# Patient Record
Sex: Female | Born: 1965
Health system: Southern US, Community
[De-identification: ages and names within clinical notes are randomized; demographics above are authoritative.]

## PROBLEM LIST (undated history)

## (undated) DIAGNOSIS — M545 Low back pain, unspecified: Secondary | ICD-10-CM

## (undated) DIAGNOSIS — M25569 Pain in unspecified knee: Secondary | ICD-10-CM

## (undated) DIAGNOSIS — K219 Gastro-esophageal reflux disease without esophagitis: Secondary | ICD-10-CM

## (undated) HISTORY — DX: Pain in unspecified knee: M25.569

## (undated) HISTORY — DX: Low back pain: M54.5

## (undated) HISTORY — DX: Low back pain, unspecified: M54.50

## (undated) HISTORY — PX: OTHER SURGICAL HISTORY: SHX169

## (undated) HISTORY — DX: Gastro-esophageal reflux disease without esophagitis: K21.9

---

## 2007-03-22 ENCOUNTER — Ambulatory Visit: Payer: Self-pay | Admitting: Obstetrics and Gynecology

## 2007-04-19 ENCOUNTER — Ambulatory Visit: Payer: Self-pay | Admitting: Obstetrics and Gynecology

## 2008-04-22 ENCOUNTER — Emergency Department (HOSPITAL_COMMUNITY): Admission: EM | Admit: 2008-04-22 | Discharge: 2008-04-22 | Payer: Self-pay | Admitting: Emergency Medicine

## 2010-12-27 ENCOUNTER — Emergency Department (HOSPITAL_COMMUNITY)
Admission: EM | Admit: 2010-12-27 | Discharge: 2010-12-27 | Disposition: A | Discharge status: Home / Self Care | Payer: Self-pay | Attending: Emergency Medicine | Admitting: Emergency Medicine

## 2010-12-27 ENCOUNTER — Emergency Department (HOSPITAL_COMMUNITY): Payer: Self-pay

## 2010-12-27 DIAGNOSIS — R509 Fever, unspecified: Secondary | ICD-10-CM | POA: Insufficient documentation

## 2010-12-27 DIAGNOSIS — R51 Headache: Secondary | ICD-10-CM | POA: Insufficient documentation

## 2010-12-27 DIAGNOSIS — R05 Cough: Secondary | ICD-10-CM | POA: Insufficient documentation

## 2010-12-27 DIAGNOSIS — IMO0001 Reserved for inherently not codable concepts without codable children: Secondary | ICD-10-CM | POA: Insufficient documentation

## 2010-12-27 DIAGNOSIS — J3489 Other specified disorders of nose and nasal sinuses: Secondary | ICD-10-CM | POA: Insufficient documentation

## 2010-12-27 DIAGNOSIS — R059 Cough, unspecified: Secondary | ICD-10-CM | POA: Insufficient documentation

## 2010-12-27 DIAGNOSIS — J069 Acute upper respiratory infection, unspecified: Secondary | ICD-10-CM | POA: Insufficient documentation

## 2011-02-07 ENCOUNTER — Other Ambulatory Visit: Payer: Self-pay | Admitting: Obstetrics and Gynecology

## 2011-02-07 ENCOUNTER — Encounter (INDEPENDENT_AMBULATORY_CARE_PROVIDER_SITE_OTHER): Payer: Self-pay | Admitting: Obstetrics and Gynecology

## 2011-02-07 DIAGNOSIS — N912 Amenorrhea, unspecified: Secondary | ICD-10-CM

## 2011-02-07 DIAGNOSIS — Z01419 Encounter for gynecological examination (general) (routine) without abnormal findings: Secondary | ICD-10-CM

## 2011-02-08 NOTE — Group Therapy Note (Signed)
[name], [name] NO.:  [id number]  MEDICAL RECORD NO.:  [id number]           PATIENT TYPE:  A  LOCATION:  [facility]                   FACILITY:  [facility]  PHYSICIAN:  Argentina Donovan, MD        DATE OF BIRTH:  Aug 17, 1966  DATE OF SERVICE:  02/07/2011                                 CLINIC NOTE  CHIEF COMPLAINT:  Annual exam.  HISTORY OF PRESENT ILLNESS:  This is a 45 year old [name] female G6, P6-0-0-6, here for annual exam.  She has no complaints today.  She does report her last Pap smear was about 3-4 years ago and is unsure if she has ever had a mammogram, I believe she has not.  She reports her last menstrual period was 8 months ago.  She denies any menopausal symptoms.  She has not been sexually active in the last 2 months, but was previously sexually active.  PAST MEDICAL HISTORY:  None.  SURGICAL HISTORY:  None.  FAMILY HISTORY:  No family history breast cancer, no history of ovarian, endometrial, or colon cancer.  GYNECOLOGICAL HISTORY:  No abnormal Pap smears, sexually transmitted diseases.  OB HISTORY:  Six pregnancies, 6 deliveries, all are term, no complications.  SOCIAL HISTORY:  No tobacco, alcohol, or drugs.  She does work in [employer] currently.  MEDICATIONS:  None.  ALLERGIES:  No known drug allergies.  PHYSICAL EXAMINATION:  VITAL SIGNS:  Temperature 98.0, pulse 86, blood pressure 123/81, weight 193, height 60 inches. GENERAL:  Pleasant female, in no acute distress. HEENT:  Head atraumatic, normocephalic. HEART:  Regular rate and rhythm.  No murmurs, rubs, or gallops. LUNGS:  Clear to auscultation bilaterally.  No wheeze, rhonchi, or rales. ABDOMEN:  Soft, nontender, nondistended.  Bowel sounds x4. GU:  No external lesions.  No discharge in the vagina or cervix.  No Pap smear performed.  No adnexal tenderness on exam.  Uterus appears anteverted.  No masses or tenderness.  Ovaries palpated with no abnormal mass or  lesions noted. EXTREMITIES:  No cyanosis, clubbing, or edema. BREASTS:  No palpable masses or nodules.  No concerning lesions in the nipple.  ASSESSMENT AND PLAN:  A 45 year old G6, P6-0-0-6 who presents for annual exam.  Pap smear performed, also breast exam performed.  We will refer for mammogram and also obtained serum pregnancy due to history of amenorrhea.  I instructed the patient to return yearly for annual exams. She reports understanding.    ______________________________ Dr. Sharol Given   ______________________________ Argentina Donovan, MD   /MEDQ  D:  02/07/2011  T:  02/08/2011  Job:  [id number]

## 2011-02-15 NOTE — Group Therapy Note (Signed)
[name], LOSCHIAVO NO.:  [id number]   MEDICAL RECORD NO.:  [id number]          PATIENT TYPE:  WOC   LOCATION:  [facility]                   FACILITY:  [facility]   PHYSICIAN:  Argentina Donovan, MD        DATE OF BIRTH:  [birth year]   DATE OF SERVICE:  03/22/2007                                  CLINIC NOTE   The patient is a 45 year old French-speaking [name] from the [place], gravida 6, para 6-0-0-6, who had an abnormal Pap smear at the  [facility] department and had an unsatisfactory colposcopy because of some  abnormal cells in the cervix and was sent in for question of LEEP versus  Cytotec followed by re-colposcopy.  Since her original Pap smear only  showed possibly a CIN-1, I have decided we will try and do the Cytotec.  I have ordered her 600 mg to be taking 2-3 hours prior to the procedure  and she will reschedule a colposcopy.   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

## 2011-03-16 ENCOUNTER — Other Ambulatory Visit: Payer: Self-pay | Admitting: Obstetrics & Gynecology

## 2011-03-16 DIAGNOSIS — Z1231 Encounter for screening mammogram for malignant neoplasm of breast: Secondary | ICD-10-CM

## 2011-03-24 ENCOUNTER — Ambulatory Visit (HOSPITAL_COMMUNITY)
Admission: RE | Admit: 2011-03-24 | Discharge: 2011-03-24 | Disposition: A | Discharge status: Home / Self Care | Payer: Self-pay | Source: Ambulatory Visit | Attending: Obstetrics & Gynecology | Admitting: Obstetrics & Gynecology

## 2011-03-24 DIAGNOSIS — Z1231 Encounter for screening mammogram for malignant neoplasm of breast: Secondary | ICD-10-CM

## 2011-07-01 LAB — URINALYSIS, ROUTINE W REFLEX MICROSCOPIC
Glucose, UA: NEGATIVE
Protein, ur: NEGATIVE
Specific Gravity, Urine: 1.03
Urobilinogen, UA: 1

## 2011-07-01 LAB — URINE MICROSCOPIC-ADD ON

## 2011-07-18 LAB — POCT PREGNANCY, URINE: Operator id: 149021

## 2011-12-08 ENCOUNTER — Encounter: Payer: Self-pay | Admitting: Family Medicine

## 2011-12-08 ENCOUNTER — Ambulatory Visit (INDEPENDENT_AMBULATORY_CARE_PROVIDER_SITE_OTHER): Payer: Self-pay | Admitting: Family Medicine

## 2011-12-08 DIAGNOSIS — Z789 Other specified health status: Secondary | ICD-10-CM

## 2011-12-08 DIAGNOSIS — Z609 Problem related to social environment, unspecified: Secondary | ICD-10-CM

## 2011-12-08 DIAGNOSIS — Z758 Other problems related to medical facilities and other health care: Secondary | ICD-10-CM | POA: Insufficient documentation

## 2011-12-08 DIAGNOSIS — M6740 Ganglion, unspecified site: Secondary | ICD-10-CM | POA: Insufficient documentation

## 2011-12-08 DIAGNOSIS — E669 Obesity, unspecified: Secondary | ICD-10-CM

## 2011-12-08 NOTE — Assessment & Plan Note (Signed)
Some mild pain but does not interfere with daily life. Expectant management. Tylenol and heat/cold therapy prn.

## 2011-12-08 NOTE — Assessment & Plan Note (Signed)
Encouraged fruits and vegetables 5x daily. Exercise goal walking 3x week for 20 minutes.

## 2011-12-08 NOTE — Progress Notes (Signed)
  Subjective:    Patient ID: [name], female    DOB: Mar 01, 1966, 46 y.o.   MRN: [id number]  HPI Patient is a 59 F primarily french speaking but with some limited English presenting to establish care. Visit somewhat limited by language barrier.   Concerns: Periods have become irregular in last several years including only having 1 period in last year. Wants to make sure this is normal. Assured patent this is normal aging/perimenopause and she is comforted.  Concern for bump on right and left wrist.  Health Maintenance/obesity-does not exercise at all. Did have a mammogram 03/2011. Pap Smear normal 02/2011.  Tries to eat a variety of food. Recent dental work-had to have abx and pain medicine for a tooth several weeks ago. Has follow up in next week.    Best contact [phone] cell.   Medical history (medical, surgical, allergies, meds, family, social) reviewed and updated in Epic.  Review of Systems negative except as noted in HPI  Objective:   Physical Exam  Constitutional: She is oriented to person, place, and time. She appears well-developed and well-nourished. No distress.  HENT:  Head: Normocephalic and atraumatic.  Right Ear: External ear normal.  Left Ear: External ear normal.  Mouth/Throat: Oropharynx is clear and moist. No oropharyngeal exudate.       Poor dentition with R sided molar that appears to have active ongoing dental work.   Eyes: Conjunctivae and EOM are normal. Pupils are equal, round, and reactive to light.  Neck: Normal range of motion. Neck supple. No thyromegaly present.  Cardiovascular: Normal rate, regular rhythm and intact distal pulses.  Exam reveals no gallop and no friction rub.   No murmur heard. Pulmonary/Chest: Effort normal and breath sounds normal. No respiratory distress. She has no wheezes. She has no rales.  Abdominal: Soft. Bowel sounds are normal. She exhibits no distension and no mass. There is no tenderness.  Musculoskeletal: Normal range of  motion. She exhibits no edema.       Bilaterally ganglion cysts approximately 3x2 cm.   Lymphadenopathy:    She has no cervical adenopathy.  Neurological: She is alert and oriented to person, place, and time.  Skin: Skin is warm and dry.  BP 125/80  Pulse 81  Temp(Src) 98.4 F (36.9 C) (Oral)  Ht 5' 1.25" (1.556 m)  Wt 189 lb (85.73 kg)  BMI 35.42 kg/m2 Assessment & Plan:  Labs/vaccines deferred as patient working on orange card.

## 2011-12-08 NOTE — Patient Instructions (Signed)
Dear Ms. [name],   It was great to see you today. Thank you for coming to clinic. We are excited to be your regular healthcare providers now. If you need anything, you can always call [phone] for an appointment.   1. You got a tetanus shot today.  2. I want you to try to walk at least 3 times each week for 20 minutes outside of your work schedule. You can take your kids along with you.   Please follow up in clinic in 6 months . Please call earlier if you have any questions or concerns.   Sincerely,  Dr. Tana Conch

## 2012-06-26 ENCOUNTER — Ambulatory Visit (INDEPENDENT_AMBULATORY_CARE_PROVIDER_SITE_OTHER): Payer: Self-pay | Admitting: Family Medicine

## 2012-06-26 ENCOUNTER — Encounter: Payer: Self-pay | Admitting: Family Medicine

## 2012-06-26 VITALS — BP 124/81 | HR 77 | Temp 97.8°F | Ht 61.25 in | Wt 191.0 lb

## 2012-06-26 DIAGNOSIS — M25519 Pain in unspecified shoulder: Secondary | ICD-10-CM

## 2012-06-26 DIAGNOSIS — E669 Obesity, unspecified: Secondary | ICD-10-CM

## 2012-06-26 DIAGNOSIS — M6740 Ganglion, unspecified site: Secondary | ICD-10-CM

## 2012-06-26 DIAGNOSIS — Z23 Encounter for immunization: Secondary | ICD-10-CM

## 2012-06-26 DIAGNOSIS — M67911 Unspecified disorder of synovium and tendon, right shoulder: Secondary | ICD-10-CM | POA: Insufficient documentation

## 2012-06-26 DIAGNOSIS — M25511 Pain in right shoulder: Secondary | ICD-10-CM

## 2012-06-26 MED ORDER — MELOXICAM 15 MG PO TABS: 15.0000 mg | ORAL_TABLET | Freq: Every day | ORAL | Status: DC

## 2012-06-26 NOTE — Assessment & Plan Note (Signed)
Still somewhat bothers patients. Discussed drainage with likelihood of recurrence and deferred unless enlarging of more painful.

## 2012-06-26 NOTE — Assessment & Plan Note (Signed)
Concern for rotator cuff tear given positive painful drop arm and pain when lifting above 90 degrees. No weakness with external rotation. Will refer to sports medicine for ultrasound evaluation. Will trial Mobic for now for antiinflammatory. Given worsening nature in patient <55, preferred further eval rather than referral to PT.

## 2012-06-26 NOTE — Patient Instructions (Addendum)
Great to see you today!  For your arm pain, I am going to prescribe you a medicine for pain and I want you to go see sports medicine doctors. Make an appointment at the front desk.   Make sure to schedule an appointment for labs and remember to only drink water before your appointment.   I want you to increase your exercise to 5 times per week. Great job on cutting out the soda and eating more fruits and vegetables.   Thanks, Dr. Durene Cal       My 5 to Fitness!  5: fruits and vegetables per day (work on 9 per day if you are at 5) 4: exercise 4-5 times per week for at least 30 minutes (walking counts!) 3: meals per day (don't skip breakfast!) 2: habits to quit -smoking -excess alcohol use (men >2 beer/day; women >1beer/day) 1: sweet per day (2 cookies, 1 small cup of ice cream, 12 oz soda)  These are general tips for healthy living. Try to start with 1 or 2 habit TODAY and make it a part of your life for several months. You set a goal today to work on: Exercise  Once you have 1 or 2 habits down for several months, try to begin working on your next healthy habit. With every single step you take, you will be leading a healthier lifestyle!

## 2012-06-26 NOTE — Progress Notes (Signed)
Subjective:   1. Obesity-presents for 6 month follow up. Patient thinks she looks fine. Explained that weight is related to long term health and not the way she looks. Patient has been walking 3x a week for 20 minutes and thinks she can increase. Patient striving to eat 5 servings of fruits and vegetables per day. Has gained 2 lbs. She has cut otu soda.   2. Right Arm/shoulder pain-patient describes 3/10 sharp pain on anterior portion of upper arm which appears to be at head of bicep. Says she must use her other arm to lift the arm due to pain. Has not tried anything for it. Worse with movement and hand above head or when dropping arm. Pain has been getting worse over last 2 weeks but has been somewhat present for 5 months. 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   ROS--See HPI  Past Medical History-smoking status noted: nonsmoker.  Reviewed problem list.  Medications- reviewed and updated Chief complaint-noted  Objective: BP 124/81  Pulse 77  Temp 97.8 F (36.6 C) (Oral)  Ht 5' 1.25" (1.556 m)  Wt 191 lb (86.637 kg)  BMI 35.80 kg/m2 Gen: NAD CV: RRR no mrg Lungs: CTAB MSK: right arm with positive painful drop arm at 90 degrees. Patient also with pain when lifting arm above 90 degrees (painful arc). No limitation in range of motion. No swelling or rash noted on arm. Pain with palpation at head of biceps.  No weakness with external rotation  Assessment/Plan: See problem oriented charted

## 2012-06-26 NOTE — Assessment & Plan Note (Addendum)
Encouraged healthy lifestyle choices including diet change and exercise increase (see AVS). Check lipids, cmet, cbc today.

## 2012-07-02 ENCOUNTER — Other Ambulatory Visit: Payer: Self-pay

## 2012-07-02 ENCOUNTER — Ambulatory Visit (INDEPENDENT_AMBULATORY_CARE_PROVIDER_SITE_OTHER): Payer: Self-pay | Admitting: *Deleted

## 2012-07-02 DIAGNOSIS — Z23 Encounter for immunization: Secondary | ICD-10-CM

## 2012-07-02 DIAGNOSIS — E669 Obesity, unspecified: Secondary | ICD-10-CM

## 2012-07-02 LAB — COMPREHENSIVE METABOLIC PANEL
ALT: 8 U/L (ref 0–35)
Albumin: 4.2 g/dL (ref 3.5–5.2)
CO2: 30 mEq/L (ref 19–32)
Calcium: 9.4 mg/dL (ref 8.4–10.5)
Chloride: 103 mEq/L (ref 96–112)
Sodium: 140 mEq/L (ref 135–145)
Total Protein: 7.5 g/dL (ref 6.0–8.3)

## 2012-07-02 LAB — LIPID PANEL
Cholesterol: 176 mg/dL (ref 0–200)
HDL: 48 mg/dL (ref >39)
Triglycerides: 136 mg/dL (ref <150)

## 2012-07-02 LAB — CBC
Platelets: 254 10*3/uL (ref 150–400)
RDW: 13.4 % (ref 11.5–15.5)
WBC: 4.3 10*3/uL (ref 4.0–10.5)

## 2012-07-02 NOTE — Progress Notes (Signed)
CBC,CMP AND FLP DONE TODAY Azir Muzyka 

## 2012-07-03 ENCOUNTER — Ambulatory Visit (INDEPENDENT_AMBULATORY_CARE_PROVIDER_SITE_OTHER): Payer: Self-pay | Admitting: Sports Medicine

## 2012-07-03 VITALS — BP 126/80 | Ht 62.0 in | Wt 194.0 lb

## 2012-07-03 DIAGNOSIS — M67919 Unspecified disorder of synovium and tendon, unspecified shoulder: Secondary | ICD-10-CM

## 2012-07-03 DIAGNOSIS — M25519 Pain in unspecified shoulder: Secondary | ICD-10-CM

## 2012-07-03 NOTE — Progress Notes (Signed)
  Subjective:    Patient ID: [name], female    DOB: [birth year], 46 y.o.   MRN: [id number]  HPI Ms. Fitzwater is here today for left shoulder pain.  History and physical completed with assistance of [place] interpreter.  The pain started 1 year ago.  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  The pain is located primarily in her mid upper arm and sometimes travels in her neck.  No pain in elbow.  She reports having to hold her arm up if she tries to raise it above her head as well as needing assistance to lower it.  Pain is worse when trying to lower arm slowly.  Also noted swelling above her elbow over the last month.  Started on meloxicam 15mg  daily last week; states has not helped.   Review of Systems     Objective:   Physical Exam Well-developed, well-nourished. No acute distress. Awake alert and oriented x3  Left shoulder: no obvious deformity of left shoulder, does have swelling in anterio-medial upper arm, no deformity of biceps muscle; pain to palpation of mid biceps and bicipital groove, no pain on palpation of shoulder or AC joint; abduction limited to 15 degrees above the horizontal plane otherwise full range of motion; pain with resisted supination and O'Brian's; positive drop test  MSK ultrasound of left shoulder: Images of the left shoulder were obtained in both longitudinal and transverse planes. Patient has appears to be a partial thickness tear of the supraspinatus tendon, best appreciated on the [place]. There is also some calcification within the tendon and minimal neovascularity. There is also fluid surrounding the biceps tendon the biceps tendon is intact. Some fluid at the a.c. joint as well. Subscapularis, infraspinatus, and teres minor appear to be within normal limits. Glenohumeral space and labrum were difficult to evaluate due to body habitus.    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  This is consistent with difficultly with overhead movements.  Biceps muscle and tendon intact but with some surrounding swelling indicating possible labral tear.  Subacromial injection with macaine and depomedrol in 3:1 ratio given after informed consent obtained. Posterior approach utilized.  Patient was provided with exercises and a theraband.  Will hold off on further imaging given insurance status; will discuss in future if conservative management fails.  Can also try nitroglycerin patch if injection does not help.  Follow up in 3 weeks.    Consent obtained and verified. Time-out conducted. Noted no overlying erythema, induration, or other signs of local infection. Skin prepped in a sterile fashion. Topical analgesic spray: Ethyl chloride. Joint:  Subacromial space Needle: 1.5 inch 25-gauge Completed without difficulty. Meds: 3 cc Marcaine, 1 cc Depo-Medrol  Advised to call if fevers/chills, erythema, induration, drainage, or persistent bleeding.

## 2012-07-12 ENCOUNTER — Encounter: Payer: Self-pay | Admitting: Family Medicine

## 2012-07-12 DIAGNOSIS — E785 Hyperlipidemia, unspecified: Secondary | ICD-10-CM | POA: Insufficient documentation

## 2012-07-24 ENCOUNTER — Ambulatory Visit (INDEPENDENT_AMBULATORY_CARE_PROVIDER_SITE_OTHER): Payer: Self-pay | Admitting: Sports Medicine

## 2012-07-24 VITALS — BP 120/80 | Ht 62.0 in | Wt 194.0 lb

## 2012-07-24 DIAGNOSIS — M25519 Pain in unspecified shoulder: Secondary | ICD-10-CM

## 2012-07-24 DIAGNOSIS — M67919 Unspecified disorder of synovium and tendon, unspecified shoulder: Secondary | ICD-10-CM

## 2012-07-24 MED ORDER — NITROGLYCERIN 0.2 MG/HR TD PT24: MEDICATED_PATCH | TRANSDERMAL | Status: DC

## 2012-07-24 NOTE — Patient Instructions (Addendum)
Nitroglycerin Protocol   Apply 1/4 nitroglycerin patch to affected area daily.  Change position of patch within the affected area every 24 hours.  You may experience a headache during the first 1-2 weeks of using the patch, these should subside.  If you experience headaches after beginning nitroglycerin patch treatment, you may take your preferred over the counter pain reliever.  Another side effect of the nitroglycerin patch is skin irritation or rash related to patch adhesive.  Please notify our office if you develop more severe headaches or rash, and stop the patch.  Tendon healing with nitroglycerin patch may require 12 to 24 weeks depending on the extent of injury.  Men should not use if taking Viagra, Cialis, or Levitra.   Do not use if you have migraines or rosacea.   This medication was sent to your pharmacy

## 2012-07-24 NOTE — Progress Notes (Signed)
  Subjective:    Patient ID: [name], female    DOB: 11-20-1965, 46 y.o.   MRN: [id number]  HPI Patient comes in today for followup on left shoulder pain. Subacromial cortisone injection at her last office visit did improve her symptoms but she still having lateral shoulder pain. History is obtained with the help of an interpreter.    Review of Systems     Objective:   Physical Exam Well-developed, well-nourished. No acute distress.  Left shoulder: Today the patient has full shoulder range of motion with a positive painful ARC. This is an improvement over her last office visit. Her rotator cuff strength is 5/5 but still reproducible of pain with resisted supraspinatus. Pain with O'Brien's test. No tenderness over the a.c. Joint. Neurovascular intact distally.  MSK ultrasound of the left shoulder was not repeated today. Ultrasound at last visit showed what appeared to be a partial thickness tear of the supraspinatus.      Assessment & Plan:  Left shoulder pain with ultrasound evidence of partial supraspinatus tendon tear  Although the patient has improved with the subacromial cortisone injection and home exercise program, her symptoms are still significant enough that I think we should start a topical nitroglycerin protocol. Quarter patch apply daily. She is warned about headaches. Continue with home exercises and followup in 4 weeks. I will plan on repeating her ultrasound at that time.

## 2012-08-20 ENCOUNTER — Encounter: Payer: Self-pay | Admitting: Sports Medicine

## 2012-08-20 ENCOUNTER — Ambulatory Visit (INDEPENDENT_AMBULATORY_CARE_PROVIDER_SITE_OTHER): Payer: Self-pay | Admitting: Sports Medicine

## 2012-08-20 VITALS — BP 115/82 | HR 76 | Ht 62.0 in | Wt 194.0 lb

## 2012-08-20 DIAGNOSIS — M75100 Unspecified rotator cuff tear or rupture of unspecified shoulder, not specified as traumatic: Secondary | ICD-10-CM

## 2012-08-20 DIAGNOSIS — S46819A Strain of other muscles, fascia and tendons at shoulder and upper arm level, unspecified arm, initial encounter: Secondary | ICD-10-CM

## 2012-08-20 NOTE — Patient Instructions (Addendum)
Nitroglycerin Protocol   Apply 1/4 nitroglycerin patch to affected area daily.  Change position of patch within the affected area every 24 hours.  You may experience a headache during the first 1-2 weeks of using the patch, these should subside.  If you experience headaches after beginning nitroglycerin patch treatment, you may take your preferred over the counter pain reliever.  Another side effect of the nitroglycerin patch is skin irritation or rash related to patch adhesive.  Please notify our office if you develop more severe headaches or rash, and stop the patch.  Tendon healing with nitroglycerin patch may require 12 to 24 weeks depending on the extent of injury.  Men should not use if taking Viagra, Cialis, or Levitra.   Do not use if you have migraines or rosacea.   Please follow up in 4 weeks  Thank you for seeing us today!  

## 2012-08-20 NOTE — Assessment & Plan Note (Addendum)
-  Patient with continued improvement with home exercise program now. Will continue HEP for now.  -ultrasound shows persistent partial thickness tear of supraspinatus.  -instructed patient on proper use of 1/4 of nitroglycerin patch and she will retry at this reduced dose. Gave patient warning on headache.  -Patient will follow up in 4 weeks, if she does not continue to improve or worsens, will consider further diagnostic imaging.

## 2012-08-20 NOTE — Progress Notes (Signed)
  Subjective:    Patient ID: [name], female    DOB: 05/24/1966, 46 y.o.   MRN: [id number]  HPI History is obtained with the help of an interpreter.  Patient comes in today for followup on left shoulder pain. Patient noted to have ultrasound concerning for partial tear of supraspinatus at first visit and received Subacromial cortisone injection at that time with some improvement in her symptoms.   Given improvement but lack of resolution at last visit, patient was placed on nitroglycerin protocol. Patient attempted nitroglycerin patches but unfortunately was using a full patch instead of 1/4 and experienced headaches due to them and had to stop after 1 week. Patient diligent with home exercise program and continues to note improvement in symptoms. She states overall 70% better but still some pain with putting her arms overhead.   Past medical history-hyperlipidemia, obesity Review of Systems-see HPI     Objective:   Physical Exam Well-developed, well-nourished. No acute distress.  Left shoulder: Full ROM at shoulder but with continued positive painful ARC.  Patient does experience pain with Neer, Hawkins, O'brien, empty can. Rotator cuff strength 5/5 despite pain. No pain with internal or external rotation.  No tenderness over the a.c. Joint. Neurovascular intact distally.  Limited MSK ultrasound of left shoulder: Images of the left shoulder were obtained in both longitudinal and transverse planes. Patient has continued appearance of a partial thickness tear of the supraspinatus tendon on articular surface on both transverse and longitudinal view which is associated with some calcification within the tendon and surrounding edema. No fluid noted around intact biceps tendon.  Subscapularis, infraspinatus, and teres minor appear to be within normal limits. Glenohumeral space and labrum were not evaluated.       Assessment & Plan:  Left shoulder pain with ultrasound evidence of partial  supraspinatus tendon tear-see problem oriented charting

## 2012-09-19 ENCOUNTER — Ambulatory Visit (INDEPENDENT_AMBULATORY_CARE_PROVIDER_SITE_OTHER): Payer: No Typology Code available for payment source | Admitting: Sports Medicine

## 2012-09-19 VITALS — BP 129/89 | Ht 65.0 in | Wt 194.0 lb

## 2012-09-19 DIAGNOSIS — S46819A Strain of other muscles, fascia and tendons at shoulder and upper arm level, unspecified arm, initial encounter: Secondary | ICD-10-CM

## 2012-09-19 DIAGNOSIS — M25519 Pain in unspecified shoulder: Secondary | ICD-10-CM

## 2012-09-19 DIAGNOSIS — M75100 Unspecified rotator cuff tear or rupture of unspecified shoulder, not specified as traumatic: Secondary | ICD-10-CM

## 2012-09-19 NOTE — Progress Notes (Signed)
  Subjective:    Patient ID: [name], female    DOB: [birth year], 46 y.o.   MRN: [id number]  HPI Patient comes in today for followup on left shoulder pain. Still experiencing pain with activity. This is despite a subacromial cortisone injection in a brief trial of topical nitroglycerin. She is using the nitroglycerin patches appropriately. Getting a little bit of a headache but not that bad. She has been diligent about her home exercises. History is obtained through an interpreter.    Review of Systems     Objective:   Physical Exam Well-developed, well-nourished. No acute distress  Left shoulder: Full range of motion with a positive painful ARC. No tenderness over the a.c. joint or oh the bicipital groove. Slightly positive empty can. Tender cuff strength is 5/5 but reproducible pain with resisted supraspinatus. Neurovascular intact distally.       Assessment & Plan:  Persistent left shoulder pain with prior ultrasound evidence of possible supraspinatus tendon tear  Since patient has failed conservative treatment to date I would like to pursue an MRI scan of her left shoulder specifically to rule out a rotator cuff tear. She will continue with her topical nitroglycerin at home exercise programs. She will followup with me one week after her MRI to go over those results and delineate further treatment.

## 2012-09-19 NOTE — Patient Instructions (Addendum)
MRI [place] ON FRI, DEC 20 AT 4PM REGISTER IN ADMITTING AT 345PM RETURN HERE FOR RESULTS ON EITHER THURS 26TH, OR FRI 27TH

## 2012-09-21 ENCOUNTER — Ambulatory Visit (HOSPITAL_COMMUNITY): Payer: Self-pay

## 2012-09-21 ENCOUNTER — Ambulatory Visit (HOSPITAL_COMMUNITY)
Admission: RE | Admit: 2012-09-21 | Discharge: 2012-09-21 | Disposition: A | Discharge status: Home / Self Care | Payer: No Typology Code available for payment source | Source: Ambulatory Visit | Attending: Sports Medicine | Admitting: Sports Medicine

## 2012-09-21 DIAGNOSIS — M75100 Unspecified rotator cuff tear or rupture of unspecified shoulder, not specified as traumatic: Secondary | ICD-10-CM

## 2012-09-21 DIAGNOSIS — M67919 Unspecified disorder of synovium and tendon, unspecified shoulder: Secondary | ICD-10-CM | POA: Insufficient documentation

## 2012-09-21 DIAGNOSIS — M25519 Pain in unspecified shoulder: Secondary | ICD-10-CM

## 2012-09-21 DIAGNOSIS — M719 Bursopathy, unspecified: Secondary | ICD-10-CM | POA: Insufficient documentation

## 2012-09-24 ENCOUNTER — Other Ambulatory Visit (HOSPITAL_COMMUNITY): Payer: Self-pay

## 2012-09-27 ENCOUNTER — Ambulatory Visit (INDEPENDENT_AMBULATORY_CARE_PROVIDER_SITE_OTHER): Payer: No Typology Code available for payment source | Admitting: Sports Medicine

## 2012-09-27 VITALS — BP 156/99 | Ht 65.0 in | Wt 194.0 lb

## 2012-09-27 DIAGNOSIS — M67919 Unspecified disorder of synovium and tendon, unspecified shoulder: Secondary | ICD-10-CM

## 2012-09-27 NOTE — Progress Notes (Signed)
Patient ID: [name], female   DOB: Oct 30, 1965, 46 y.o.   MRN: [id number]  Patient comes in today to go over MRI findings of her left shoulder. MRI shows rotator cuff tendinopathy but no tear. She also has evidence of subacromial bursitis and moderate a.c. DJD.  Given the fact that there is no tear on her MRI I think we should continue with conservative treatment for now. She will continue with her topical nitroglycerin using a quarter patch daily. Continue with her home exercise program as well. Followup in 6 weeks.

## 2012-10-30 ENCOUNTER — Ambulatory Visit: Payer: No Typology Code available for payment source | Admitting: Family Medicine

## 2012-11-08 ENCOUNTER — Ambulatory Visit (INDEPENDENT_AMBULATORY_CARE_PROVIDER_SITE_OTHER): Payer: No Typology Code available for payment source | Admitting: Sports Medicine

## 2012-11-08 VITALS — BP 124/90 | Ht 65.0 in | Wt 194.0 lb

## 2012-11-08 DIAGNOSIS — M67919 Unspecified disorder of synovium and tendon, unspecified shoulder: Secondary | ICD-10-CM

## 2012-11-08 DIAGNOSIS — M719 Bursopathy, unspecified: Secondary | ICD-10-CM

## 2012-11-09 NOTE — Progress Notes (Signed)
  Subjective:    Patient ID: Pauline Good, female    DOB: 06/17/1966, 47 y.o.   MRN: [id number]  HPI Patient comes in today for followup on left shoulder pain. History is obtained with the help of an interpreter. Patient feels like she is about 40% better. She discontinued her nitroglycerin patch due to headaches. She has been doing her home exercises. Recent MRI scan showed supraspinatus tendinopathy but no discrete tear.    Review of Systems     Objective:   Physical Exam Well-developed, well-nourished. No acute distress.  Left shoulder: Full range of motion. Still with a positive painful ARC. Rotator cuff strength remains 5/5 but there is still some pain with resisted supraspinatus. No atrophy. Neurovascularly intact distally.       Assessment & Plan:  1. Left shoulder pain secondary to rotator cuff tendinopathy  Patient will continue with her home exercise plan and followup with me in 6 weeks. If symptoms do not continue to improve then we can consider orthopedic surgical referral for rotator cuff debridement but the patient's limited finances may make this challenging.

## 2012-12-20 ENCOUNTER — Encounter: Payer: Self-pay | Admitting: Sports Medicine

## 2012-12-20 ENCOUNTER — Ambulatory Visit (INDEPENDENT_AMBULATORY_CARE_PROVIDER_SITE_OTHER): Payer: Self-pay | Admitting: Sports Medicine

## 2012-12-20 VITALS — BP 149/94 | HR 80 | Ht 65.0 in | Wt 194.0 lb

## 2012-12-20 DIAGNOSIS — M25512 Pain in left shoulder: Secondary | ICD-10-CM

## 2012-12-20 DIAGNOSIS — M67919 Unspecified disorder of synovium and tendon, unspecified shoulder: Secondary | ICD-10-CM

## 2012-12-20 DIAGNOSIS — M67912 Unspecified disorder of synovium and tendon, left shoulder: Secondary | ICD-10-CM

## 2012-12-20 DIAGNOSIS — M25519 Pain in unspecified shoulder: Secondary | ICD-10-CM

## 2012-12-20 NOTE — Progress Notes (Signed)
  Subjective:    Patient ID: [name], female    DOB: 1966/04/06, 47 y.o.   MRN: [id number]  HPI Patient comes in today for followup on left shoulder pain. She has reached a plateau. Symptoms have been present now for a year and a half. She has failed conservative treatment to date including a subacromial cortisone injection. MRI scan of her left shoulder back in December showed rotator cuff tendinopathy without tear. We tried a trial of topical nitroglycerin but she did not tolerate this due to headaches.    Review of Systems     Objective:   Physical Exam Well-developed, well-nourished. No acute distress. Vital signs are reviewed  Left shoulder: Patient still demonstrates full range of motion with a positive painful ARC. Positive empty can positive Hawkins. Rotator cuff strength remains 5/5 but reproducible pain with resisted supraspinatus. Neurovascularly intact distally.       Assessment & Plan:  1. Chronic left shoulder pain secondary to rotator cuff tendinopathy  Patient wants to exhaust all conservative efforts before proceeding with surgery. I will send her to physical therapy for the next 6 weeks. Followup with me at the end of that time. If symptoms persist she will need to seriously consider orthopedic consultation for consideration of rotator cuff debridement and distal clavicle excision. Given her lack of insurance we would try to arrange this either in [place] or in [place].

## 2013-01-01 ENCOUNTER — Ambulatory Visit: Payer: No Typology Code available for payment source | Attending: Sports Medicine | Admitting: Physical Therapy

## 2013-01-01 DIAGNOSIS — IMO0001 Reserved for inherently not codable concepts without codable children: Secondary | ICD-10-CM | POA: Insufficient documentation

## 2013-01-01 DIAGNOSIS — M25519 Pain in unspecified shoulder: Secondary | ICD-10-CM | POA: Insufficient documentation

## 2013-01-07 ENCOUNTER — Ambulatory Visit: Payer: No Typology Code available for payment source | Admitting: Family Medicine

## 2013-01-08 ENCOUNTER — Ambulatory Visit: Payer: No Typology Code available for payment source | Admitting: Physical Therapy

## 2013-01-14 ENCOUNTER — Ambulatory Visit: Payer: No Typology Code available for payment source | Admitting: Physical Therapy

## 2013-01-21 ENCOUNTER — Encounter: Payer: Self-pay | Admitting: Family Medicine

## 2013-01-22 ENCOUNTER — Encounter: Payer: Self-pay | Admitting: Family Medicine

## 2013-01-22 ENCOUNTER — Ambulatory Visit (INDEPENDENT_AMBULATORY_CARE_PROVIDER_SITE_OTHER): Payer: No Typology Code available for payment source | Admitting: Family Medicine

## 2013-01-22 ENCOUNTER — Ambulatory Visit: Payer: No Typology Code available for payment source | Admitting: Physical Therapy

## 2013-01-22 VITALS — BP 150/99 | HR 76 | Wt 201.0 lb

## 2013-01-22 DIAGNOSIS — R51 Headache: Secondary | ICD-10-CM

## 2013-01-22 DIAGNOSIS — R519 Headache, unspecified: Secondary | ICD-10-CM | POA: Insufficient documentation

## 2013-01-22 DIAGNOSIS — G43909 Migraine, unspecified, not intractable, without status migrainosus: Secondary | ICD-10-CM

## 2013-01-22 MED ORDER — SUMATRIPTAN SUCCINATE 6 MG/0.5ML ~~LOC~~ SOLN
6.0000 mg | Freq: Once | SUBCUTANEOUS | Status: AC
  Administered 2013-01-22: 6 mg via SUBCUTANEOUS

## 2013-01-22 NOTE — Assessment & Plan Note (Addendum)
Concern for migraines. Pounding. Over 4 hours in duration. Nausea. Disabling. 4/5 POUND symptoms. Attempted imitrex in office at 6mg . Patient left office but got to feeling dizzy after injection and returned for approximately 30 minutes. Pain was 9/10 before shot and went up to 10/10. Blood pressure elevated to 150/99 due to pain. After observation, pain down to 6/10 and dizziness had resolved. BP normalized.  Possible reaction to IM shot vs. Effects of medicine. Will avoid IM imitrex in future but consider toradol. Did appear patient had some relief. May need to consider controller medication. Follow up in 1-2 weeks.   Doubt medication overuse as only using pills x1 every 3 days (but still possible). Possible tension headache. As above, I favor migraines.  Also tested vision which was 20/50 and 20/60 and patient does seem to have worse pain with straining so will refer to optometry. No focal neurological deficits or other red flags.

## 2013-01-22 NOTE — Patient Instructions (Signed)
We gave you a shot to try to help with your headache. You may be having migraines. Please try to write down how much these help you.  Your vision is certainly not helping though and we will send you to the eye doctor (this can take a few months at time).   See me in 2 weeks to see if things are better-we may need to start you on another medication to help prevent these headaches,  Dr. Durene Cal

## 2013-01-22 NOTE — Progress Notes (Signed)
Subjective:   1. Headache-started 3 months ago (no trigger for when it started) regularly but has had history of headaches in the past for at least a year. More frequently now. At age 47 had very similar headaches but these resolved completely until a year ago. These headaches do  feel similar to previous headaches. Frontal headache that also is "behind the eyes" but always bilateral. Also feels pain in the neck. 10/10 most of the time. Pounding sensation.Goes down to 5/10 after ibuprofen 800mg  and usually stays away for 2-3 days. It hurts if she speaks or tries to move her head. Positive photophobia. No phonophobia. No injury. Nausea is a regular occurence.  Occasionally dizzy. No blurry vision.  Makes daily tasks difficult and wants to lay around. No unilateral tearing or runny nose.   Triggers-unknown.  Drinks coffee but irregularly, no soda.   Health Maintenance-up to date  Topic Date Due  . Influenza Vaccine  06/03/2013  . Pap Smear  02/06/2014  . Tetanus/tdap  06/26/2022    ROS--See HPI  Past Medical History Patient Active Problem List  Diagnosis  . Language Barrier  . Ganglion cyst  . Obesity (BMI 30.0-34.9)  . supraspinatus tendon partial tear  . Hyperlipidemia LDL goal < 160  Family history-no history of migraines.  Reviewed problem list.  Medications- reviewed and updated Chief complaint-noted  Objective: BP 134/85  Pulse 91  Wt 201 lb (91.173 kg)  BMI 33.45 kg/m2 Gen: NAD, holds head at times HEENT: NCAT, MMM, PERRLA  CV: RRR no mrg  Lungs: CTAB  MSK: moves all extremities, no edema  Skin: warm and dry, no rash  Neuro: CN II-XII intact, sensation and reflexes normal throughout, 5/5 muscle strength in bilateral upper and lower extremities. Normal finger to nose.   Assessment/Plan:

## 2013-01-23 ENCOUNTER — Ambulatory Visit (INDEPENDENT_AMBULATORY_CARE_PROVIDER_SITE_OTHER): Payer: No Typology Code available for payment source | Admitting: Family Medicine

## 2013-01-23 VITALS — BP 134/80 | HR 88 | Temp 98.4°F | Ht 65.0 in | Wt 199.0 lb

## 2013-01-23 DIAGNOSIS — R51 Headache: Secondary | ICD-10-CM

## 2013-01-23 DIAGNOSIS — M25519 Pain in unspecified shoulder: Secondary | ICD-10-CM

## 2013-01-23 DIAGNOSIS — M25511 Pain in right shoulder: Secondary | ICD-10-CM

## 2013-01-23 MED ORDER — MELOXICAM 15 MG PO TABS: 15.0000 mg | ORAL_TABLET | Freq: Every day | ORAL | Status: DC

## 2013-01-23 MED ORDER — SUMATRIPTAN SUCCINATE 50 MG PO TABS: 50.0000 mg | ORAL_TABLET | ORAL | Status: DC | PRN

## 2013-01-23 MED ORDER — KETOROLAC TROMETHAMINE 30 MG/ML IJ SOLN
30.0000 mg | Freq: Once | INTRAMUSCULAR | Status: AC
  Administered 2013-01-23: 30 mg via INTRAMUSCULAR

## 2013-01-23 NOTE — Assessment & Plan Note (Addendum)
She is back from persistent headache. It seems diffuse but more on her left side today but it seems to change location. Sometimes associated with nausea but not consistently with photophobia. She has not tried any medications since Imitrex IM yesterday in clinic which actually may have helped a little bit.  -Due to facial pain, will check ESR, although temporal arteritis is somewhat low on differential. Will be reassured if negative.>>>neg -Try Imitrex prn. If this seems to help, consider prophylactic treatment of migraines. Beta-blocker may work well for her since she is concerned about her blood pressure (although normal today) and wanted something to relax her.  -Follow-up in a few days with PCP.

## 2013-01-23 NOTE — Patient Instructions (Addendum)
Make an appointment for next Tuesday afternoon with Dr. Durene Cal.   Try the Imitrex (new medication) if you get a headache.

## 2013-01-23 NOTE — Progress Notes (Signed)
  Subjective:    Patient ID: [name], female    DOB: 29-Mar-1966, 47 y.o.   MRN: [id number]  HPI # Headaches She was seen yesterday by another resident for this issue. She has been having for the past 3 months, increasing in frequency, no daily. She was diagnosed with likely migraines. She had a reaction to Imitrex (worsened headache). She was also referred to optometry due to her decreased visual acuity and concern that her straining may be contributing to headaches.   She thinks Imitrex may have actually helped.  She is concerned because the headaches are travelling. Yesterday her entire head was hurting.  Today, half of her head is cold and the left side of her head is very hot and painful and coming through to her eyes and neck.  Medications tried:  -She has not taken any medications since Imitrex yesterday. She does not think ibuprofen is helping.   Also, her blood pressure was high this [facility] at [facility].  She would like medication for blood pressure and for anxiety.  Review of Systems Denies vomiting but endorses nausea Denies falls, dizziness Denies photophobia/phonophobia Denies fevers, chills, new stressors/depression   Allergies, medication, past medical history reviewed.  Smoking status noted.     Objective:   Physical Exam GEN: NAD; well-nourished, -appearing HEENT:   Head: [place]; ?tenderness along left mandible   Shoulder: mild tenderness along left trapezius    Eyes: normal conjunctiva without injection or tearing   Nose: no rhinorrhea, normal turbinates   Mouth: MMM NECK: no LAD; supple neck with Mcfarland ROM  CV: RRR PULM: NI WOB NEURO: moves all extremities well; normal gait  Toradol injection given Prior to shot 8/10 headache, following shot unchanged     Assessment & Plan:

## 2013-01-23 NOTE — Addendum Note (Signed)
Addended by: Jone Baseman D on: 01/23/2013 04:58 PM   Modules accepted: Orders

## 2013-01-24 ENCOUNTER — Ambulatory Visit: Payer: No Typology Code available for payment source | Admitting: Physical Therapy

## 2013-01-24 LAB — SEDIMENTATION RATE: Sed Rate: 6 mm/hr (ref 0–22)

## 2013-01-29 ENCOUNTER — Encounter: Payer: Self-pay | Admitting: Family Medicine

## 2013-01-29 ENCOUNTER — Ambulatory Visit (INDEPENDENT_AMBULATORY_CARE_PROVIDER_SITE_OTHER): Payer: No Typology Code available for payment source | Admitting: Family Medicine

## 2013-01-29 VITALS — BP 132/85 | HR 92 | Wt 198.0 lb

## 2013-01-29 DIAGNOSIS — R51 Headache: Secondary | ICD-10-CM

## 2013-01-29 DIAGNOSIS — M6740 Ganglion, unspecified site: Secondary | ICD-10-CM

## 2013-01-29 DIAGNOSIS — E669 Obesity, unspecified: Secondary | ICD-10-CM

## 2013-01-29 MED ORDER — PROPRANOLOL HCL 40 MG PO TABS: 40.0000 mg | ORAL_TABLET | Freq: Two times a day (BID) | ORAL | Status: DC

## 2013-01-29 NOTE — Patient Instructions (Signed)
We are going to start you on something to help prevent migraines (propranolol).  I think you may now be having medication overuse headaches so avoid ibuprofen or your mobic for at least a week.   See me back in 1-2 weeks to check up on things,  Dr. Durene Cal  P.S. Get your eyes checked still (our referral didn't work).

## 2013-01-31 ENCOUNTER — Ambulatory Visit (INDEPENDENT_AMBULATORY_CARE_PROVIDER_SITE_OTHER): Payer: No Typology Code available for payment source | Admitting: Sports Medicine

## 2013-01-31 ENCOUNTER — Encounter: Payer: Self-pay | Admitting: Family Medicine

## 2013-01-31 VITALS — BP 132/83 | Ht 65.0 in | Wt 194.0 lb

## 2013-01-31 DIAGNOSIS — M719 Bursopathy, unspecified: Secondary | ICD-10-CM

## 2013-01-31 DIAGNOSIS — M25512 Pain in left shoulder: Secondary | ICD-10-CM

## 2013-01-31 DIAGNOSIS — M25519 Pain in unspecified shoulder: Secondary | ICD-10-CM

## 2013-01-31 DIAGNOSIS — M67919 Unspecified disorder of synovium and tendon, unspecified shoulder: Secondary | ICD-10-CM

## 2013-01-31 DIAGNOSIS — M67912 Unspecified disorder of synovium and tendon, left shoulder: Secondary | ICD-10-CM

## 2013-01-31 MED ORDER — METHYLPREDNISOLONE ACETATE 40 MG/ML IJ SUSP
40.0000 mg | Freq: Once | INTRAMUSCULAR | Status: AC
  Administered 2013-01-31: 40 mg via INTRA_ARTICULAR

## 2013-01-31 NOTE — Progress Notes (Signed)
Subjective:   1. Headaches-patient presents for follow up of headaches of 3 months duration. i saw her 4/22 and was concerned for migraine headaches (pounding, assc. Nausea, disabling, over 4 hours in duration typically). Advised patient to see optometry at that time due to decreased visual acuity and need for glasses and ? If straining was related to headaches.  Sumatriptan was given in office and temporarily worsened headache before improving it. Patient followed up for HA the next day 4/23 which recurred (and usually was happening every 3rd day). Character of HA had changed slightly and location had shifted some onto her face so ESR was checked for temporal arteritis and was not elevated. Patient given imitrex prn to use at home as well as mobic.   Today, patient returns and states headaches are now occuring on a daily basis. She used mobic daily for the last week which would bring HA down from 8/10 to 6/10 but ibuprofen formerly brought it down to 5/10. Patient no longer describing headache as pounding, more of a burning or aching. No light sensitivity. Minimal nausea in this week. Sumatriptan used once on day before visit and pain worsened temporarily once again before getting slightly better. HA still frontal, lasting longer than 4 hours. No unilateral tearing or runny nose. Some tightness in her neck. No fever/chills. Continues to only occasionally have coffee. No headache currently at this time.   ROS--See HPI  Past Medical History Patient Active Problem List   Diagnosis Date Noted  . Headache 01/22/2013  . Hyperlipidemia LDL goal < 160 07/12/2012  . supraspinatus tendon partial tear 06/26/2012  . Language Barrier 12/08/2011  . Ganglion cyst 12/08/2011  . Obesity (BMI 30.0-34.9) 12/08/2011  Family history-no history of migraines.  Reviewed problem list.  Medications- reviewed and updated Chief complaint-noted  Objective: BP 132/85  Pulse 92  Wt 198 lb (89.812 kg)  BMI 32.95  kg/m2 Gen: NAD, holds head at times HEENT: NCAT, MMM, PERRLA  CV: RRR no mrg  Lungs: CTAB  MSK: moves all extremities, no edema, no neck stiffness or meningeal signs Skin: warm and dry, no rash  Neuro: CN II-XII intact, sensation and reflexes normal throughout, 5/5 muscle strength in bilateral upper and lower extremities. Normal finger to nose. Deferred optic disc exam to optometry for dilated eye exam.   Assessment/Plan:

## 2013-01-31 NOTE — Assessment & Plan Note (Addendum)
Worsening. Doubt central etiology given no meningeal signs, no focal neuro deficits, no vomiting and nausea now minimal.  I am uncertain of baseline type of HA at this point (some features of migraine-see note 4/22 with features now changed as no longer pounding and patient minimizing nausea symptoms currently-  but may be tension type as well). Given this has developed into daily headache, will start prophylaxis with propranolol and have patient follow up in 1-2 weeks. 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 Also, avoid caffeine and follow up with optometry given decreased visual acuity. Will discuss weight loss at next visit as well as this can help with chronic headaches.

## 2013-02-01 NOTE — Progress Notes (Signed)
  Subjective:    Patient ID: [name], female    DOB: 03-Jan-1966, 47 y.o.   MRN: [id number]  HPI  Patient comes in today for followup on chronic left shoulder pain. She has rotator cuff tendinopathy which is failed to respond to conservative treatment to date. She does not feel like she is losing motion in the shoulder but rather has persistent pain with activity. She has attended a total of 5 physical therapy visits has not noticed any improvement.She is here today with her interpreter.   Review of Systems     Objective:   Physical Exam Well-developed, well-nourished. No acute distress  Left shoulder: Patient still shows full range of motion with a painful ARC. She has a positive empty can and a positive Hawkins. Rotator cuff strength is 5/5 reproducible pain with resisted supraspinatus. Neurovascularly intact distally.       Assessment & Plan:  1. Chronic left shoulder pain secondary to rotator cuff tendinopathy  I discussed the patient's options including repeating a subacromial cortisone injection, simply living with her pain, or referral to orthopedics for consideration of a subacromial decompression and rotator cuff debridement. Patient would like to try a repeat subacromial cortisone injection first. I think that is reasonable. She has a Mcfarland understanding of her home exercise program and since she is not getting any benefit from formal therapy I think she can stop this. She will followup again in 4 weeks. If she gets no improvement with today's repeat cortisone injection we will reconsider the merits of referral to orthopedics.  Consent obtained and verified. Time-out conducted. Noted no overlying erythema, induration, or other signs of local infection. Skin prepped in a sterile fashion. Topical analgesic spray: Ethyl chloride. Joint: left subacromial Needle: 25g 1 1/2 inch Completed without difficulty. Meds: 3cc 1% xylocaine, 1cc (40mg ) depomedrol  Advised to call if  fevers/chills, erythema, induration, drainage, or persistent bleeding.

## 2013-02-06 ENCOUNTER — Ambulatory Visit: Payer: No Typology Code available for payment source | Admitting: Family Medicine

## 2013-02-20 ENCOUNTER — Ambulatory Visit: Payer: No Typology Code available for payment source | Admitting: Family Medicine

## 2013-02-28 ENCOUNTER — Ambulatory Visit: Payer: No Typology Code available for payment source | Admitting: Sports Medicine

## 2013-03-15 ENCOUNTER — Ambulatory Visit: Payer: No Typology Code available for payment source | Admitting: Family Medicine

## 2013-07-15 ENCOUNTER — Ambulatory Visit (INDEPENDENT_AMBULATORY_CARE_PROVIDER_SITE_OTHER): Payer: Self-pay | Admitting: Sports Medicine

## 2013-07-15 ENCOUNTER — Encounter: Payer: Self-pay | Admitting: Sports Medicine

## 2013-07-15 VITALS — BP 117/78 | Ht 65.0 in | Wt 194.0 lb

## 2013-07-15 DIAGNOSIS — M25561 Pain in right knee: Secondary | ICD-10-CM

## 2013-07-15 DIAGNOSIS — M25569 Pain in unspecified knee: Secondary | ICD-10-CM

## 2013-07-15 MED ORDER — METHYLPREDNISOLONE ACETATE 40 MG/ML IJ SUSP
40.0000 mg | Freq: Once | INTRAMUSCULAR | Status: AC
  Administered 2013-07-15: 40 mg via INTRA_ARTICULAR

## 2013-07-15 NOTE — Progress Notes (Signed)
  Subjective:    Patient ID: [name], female    DOB: Feb 28, 1966, 47 y.o.   MRN: [id number]  HPI complaint: Right knee pain Patient comes in today complaining of 3 weeks of right knee pain. No injury but rather a gradual onset of diffuse pain and swelling. Her pain has improved but not resolved. It is worse with standing and walking. Improves at rest. Occasional catching and popping. No instability. No fevers or chills. No pain more proximally in the groin. History is obtained through the help of an interpreter.    Review of Systems     Objective:   Physical Exam Obese. No acute distress. Sitting comfortable in the exam room  Knee: Full range of motion. Trace effusion. 3+ patellofemoral crepitus. Tender to palpation along medial and lateral joint lines but negative McMurray's. Mcfarland joint stability. Neurovascularly intact distally. Walking with only a slight limp.       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 She can increase activity as tolerated thereafter. If symptoms persist I would start with plain x-rays of the right knee. Followup for ongoing or recalcitrant issues.  Consent obtained and verified. Time-out conducted. Noted no overlying erythema, induration, or other signs of local infection. Skin prepped in a sterile fashion. Topical analgesic spray: Ethyl chloride. Joint: right knee, anterior medial approach Needle: 25g 1.5 inch Completed without difficulty. Meds: 3cc 1% xylocaine, 1cc (40mg ) depomedrol  Advised to call if fevers/chills, erythema, induration, drainage, or persistent bleeding.

## 2013-09-20 ENCOUNTER — Encounter (HOSPITAL_COMMUNITY): Payer: Self-pay | Admitting: Emergency Medicine

## 2013-09-20 ENCOUNTER — Emergency Department (HOSPITAL_COMMUNITY)
Admission: EM | Admit: 2013-09-20 | Discharge: 2013-09-20 | Disposition: A | Discharge status: Home / Self Care | Payer: No Typology Code available for payment source | Attending: Emergency Medicine | Admitting: Emergency Medicine

## 2013-09-20 DIAGNOSIS — R51 Headache: Secondary | ICD-10-CM | POA: Insufficient documentation

## 2013-09-20 DIAGNOSIS — IMO0001 Reserved for inherently not codable concepts without codable children: Secondary | ICD-10-CM | POA: Insufficient documentation

## 2013-09-20 DIAGNOSIS — R05 Cough: Secondary | ICD-10-CM | POA: Insufficient documentation

## 2013-09-20 DIAGNOSIS — R509 Fever, unspecified: Secondary | ICD-10-CM | POA: Insufficient documentation

## 2013-09-20 DIAGNOSIS — H9209 Otalgia, unspecified ear: Secondary | ICD-10-CM | POA: Insufficient documentation

## 2013-09-20 DIAGNOSIS — R42 Dizziness and giddiness: Secondary | ICD-10-CM | POA: Insufficient documentation

## 2013-09-20 DIAGNOSIS — R059 Cough, unspecified: Secondary | ICD-10-CM | POA: Insufficient documentation

## 2013-09-20 DIAGNOSIS — Z79899 Other long term (current) drug therapy: Secondary | ICD-10-CM | POA: Insufficient documentation

## 2013-09-20 DIAGNOSIS — I10 Essential (primary) hypertension: Secondary | ICD-10-CM | POA: Insufficient documentation

## 2013-09-20 MED ORDER — BENZONATATE 100 MG PO CAPS: 100.0000 mg | ORAL_CAPSULE | Freq: Three times a day (TID) | ORAL | Status: DC

## 2013-09-20 MED ORDER — DM-GUAIFENESIN ER 30-600 MG PO TB12: 1.0000 | ORAL_TABLET | Freq: Two times a day (BID) | ORAL | Status: DC

## 2013-09-20 MED ORDER — KETOROLAC TROMETHAMINE 60 MG/2ML IM SOLN
60.0000 mg | Freq: Once | INTRAMUSCULAR | Status: AC
  Administered 2013-09-20: 60 mg via INTRAMUSCULAR
  Filled 2013-09-20: qty 2

## 2013-09-20 MED ORDER — NAPROXEN 500 MG PO TABS: 500.0000 mg | ORAL_TABLET | Freq: Two times a day (BID) | ORAL | Status: DC

## 2013-09-20 NOTE — ED Notes (Signed)
Pt c/o non productive cough x1 day, cough causes her chest to hurt, states she's unable to get anything up.

## 2013-09-20 NOTE — ED Provider Notes (Signed)
CSN: [id number]     Arrival date & time 09/20/13  1749 History   First MD Initiated Contact with Patient 09/20/13 1848     This chart was scribed for non-physician practitioner, Lowella Dell PA-C, working with Layla Maw Ward, DO by Arlan Organ, ED Scribe. This patient was seen in room TR09C/TR09C and the patient's care was started at 7:01 PM.   Chief Complaint  Patient presents with  . Cough   HPI  HPI Comments: [name] is a 47 y.o. female who presents to the Emergency Department complaining of a constant dry cough that woke her from sleep initially starting yesterday. She also reports right otalgia, body aches, subjective fever, chills, and periods of dizziness brought on by walking. She also reports a HA which she states usually occurs when she gets a cold. She has tried Nyquil and Dayquil with no relief. She states her daughter is currently sick with similar symptoms, but has not been given anything to improve her symptoms. She denies nausea, vomiting, CP, SOB, sore throat. She denies being a smoker, but says her husband currently smokes. She denies any known allergies to medications. She denies any known medical conditions.  History reviewed. No pertinent past medical history. History reviewed. No pertinent past surgical history. History reviewed. No pertinent family history. History  Substance Use Topics  . Smoking status: Never Smoker   . Smokeless tobacco: Not on file  . Alcohol Use: No   OB History   Grav Para Term Preterm Abortions TAB SAB Ect Mult Living                 Review of Systems  Constitutional: Positive for chills.  Respiratory: Positive for cough. Negative for shortness of breath.   Cardiovascular: Negative for chest pain.  Gastrointestinal: Negative for nausea and vomiting.  All other systems reviewed and are negative.    Allergies  Review of patient's allergies indicates no known allergies.  Home Medications   Current Outpatient Rx  Name  Route   Sig  Dispense  Refill  . DM-Phenylephrine-Acetaminophen (VICKS DAYQUIL MULTI-SYMPTOM) 10-5-325 MG CAPS   Oral   Take 1 capsule by mouth 2 (two) times daily as needed (for cough/cold symptoms).         Marland Kitchen ibuprofen (ADVIL,MOTRIN) 200 MG tablet   Oral   Take 800 mg by mouth every 8 (eight) hours as needed for moderate pain.         . benzonatate (TESSALON) 100 MG capsule   Oral   Take 1 capsule (100 mg total) by mouth every 8 (eight) hours.   21 capsule   0   . dextromethorphan-guaiFENesin (MUCINEX DM) 30-600 MG per 12 hr tablet   Oral   Take 1 tablet by mouth 2 (two) times daily.   20 tablet   0   . naproxen (NAPROSYN) 500 MG tablet   Oral   Take 1 tablet (500 mg total) by mouth 2 (two) times daily.   30 tablet   0    Triage Vitals: BP 156/101  Pulse 89  Temp(Src) 98.2 F (36.8 C)  Resp 16  SpO2 96%  Physical Exam  Nursing note and vitals reviewed. Constitutional: She is oriented to person, place, and time. She appears well-developed and well-nourished. No distress.  HENT:  Head: Normocephalic and atraumatic.  Right Ear: Tympanic membrane is not injected and not erythematous. A middle ear effusion is present.  Left Ear: Tympanic membrane is not injected and not erythematous. A middle  ear effusion is present.  Eyes: EOM are normal. Pupils are equal, round, and reactive to light.  Neck: Normal range of motion. Neck supple.  Cardiovascular: Normal rate, regular rhythm and normal heart sounds.   Pulmonary/Chest: Effort normal and breath sounds normal.  Musculoskeletal: Normal range of motion.  Neurological: She is alert and oriented to person, place, and time.  Skin: Skin is warm and dry. She is not diaphoretic.  Psychiatric: She has a normal mood and affect. Her behavior is normal.    ED Course  Procedures (including critical care time)  DIAGNOSTIC STUDIES: Oxygen Saturation is 96% on RA, adequate by my interpretation.    COORDINATION OF CARE: 7:09  PM-Discussed treatment plan with pt at bedside and pt agreed to plan.     Labs Review Labs Reviewed - No data to display Imaging Review No results found.  EKG Interpretation   None       MDM   1. Cough   2. High blood pressure    Patient afebrile with no signs of acute infection. Patient hypertensive at admission though resolved prior to discharge, suspect elevated BP related to patient taking phenylephrine products OTC. Advised patient to discontinue the OTC cold medication and begin prescribed medications. Patient advised to follow up with PCP in 2 days and to reevaluate her BP. Patient agrees with plan. Discharged in good condition.  Meds given in ED:  Medications  ketorolac (TORADOL) injection 60 mg (60 mg Intramuscular Given 09/20/13 1946)    Discharge Medication List as of 09/20/2013  7:15 PM    START taking these medications   Details  benzonatate (TESSALON) 100 MG capsule Take 1 capsule (100 mg total) by mouth every 8 (eight) hours., Starting 09/20/2013, Until Discontinued, Print    dextromethorphan-guaiFENesin (MUCINEX DM) 30-600 MG per 12 hr tablet Take 1 tablet by mouth 2 (two) times daily., Starting 09/20/2013, Until Discontinued, Print    naproxen (NAPROSYN) 500 MG tablet Take 1 tablet (500 mg total) by mouth 2 (two) times daily., Starting 09/20/2013, Until Discontinued, Print         I personally performed the services described in this documentation, which was scribed in my presence. The recorded information has been reviewed and is accurate.   Rudene Anda, PA-C 09/21/13 [phone]

## 2013-09-20 NOTE — Discharge Instructions (Signed)
Take medications as prescribed. Follow up with your PCP in 2 days. If symptoms should worsen please return to ED. Blood pressure elevated at today's visit, likely a result of Phenylephrine. Recommend addressing this at follow up with PCP.

## 2013-09-22 NOTE — ED Provider Notes (Signed)
Medical screening examination/treatment/procedure(s) were performed by non-physician practitioner and as supervising physician I was immediately available for consultation/collaboration.  EKG Interpretation   None         Kristen N Ward, DO 09/22/13 1351 

## 2013-09-25 ENCOUNTER — Encounter: Payer: Self-pay | Admitting: Family Medicine

## 2013-09-25 ENCOUNTER — Ambulatory Visit (INDEPENDENT_AMBULATORY_CARE_PROVIDER_SITE_OTHER): Payer: Self-pay | Admitting: Family Medicine

## 2013-09-25 VITALS — BP 132/82 | HR 79 | Temp 99.1°F | Ht 65.0 in | Wt 194.0 lb

## 2013-09-25 DIAGNOSIS — R51 Headache: Secondary | ICD-10-CM

## 2013-09-25 MED ORDER — KETOROLAC TROMETHAMINE 60 MG/2ML IM SOLN
60.0000 mg | Freq: Once | INTRAMUSCULAR | Status: AC
  Administered 2013-09-25: 60 mg via INTRAMUSCULAR

## 2013-09-25 MED ORDER — KETOROLAC TROMETHAMINE 60 MG/2ML IM SOLN: 60.0000 mg | Freq: Once | INTRAMUSCULAR | Status: DC

## 2013-09-25 NOTE — Assessment & Plan Note (Signed)
Seen multiple times by Dr. Durene Cal in the past, reviewed the chart and with today's physical and history, doubt any central process as no focal deficits, no nausea today, no vomiting, no scotoma, no photophobia. Pt was previously given propranolol PPx back in April 2014, however, she took one pill and then did not further take the medication due to SE???.  As well, she has not tried anything for acute HA today but have a feeling this is medication overuse HA.  Will give Toradol 60 mg x 1 today and have her f/u with Dr. Durene Cal in the next two weeks to discuss possible PPx and again emphasize to her the need to take medication as directed along with following with optometry.  Could consider a Pseudotumor cerebri picture and give Acetazolamide a try if that picture is portrayed by the optometrist.

## 2013-09-25 NOTE — Patient Instructions (Signed)
[name], please do not take any ibuprofen, Naprosyn, or any other NSAID today for your headache.  As well, please follow up with Dr. Durene Cal in 1-2 weeks.    Thanks, Dr. Paulina Fusi   Headaches, Analgesic Rebound Analgesic agents are prescription or over-the-counter medications used to control pain, including headaches. However, overuse or misuse of theses medications can lead to rebound headaches. Rebound headaches are headaches that recur after the analgesic medication wears off. Eventually, the rebound headaches can become longlasting (chronic). If this happens, you must completely stop using analgesic medications. If not, the chronic headache is likely to continue despite the use of any other treatment. Usually when you stop taking analgesic medications, the headache may initally get worse for several days. Along with this you may experience sickness in your stomach (nausea), and you may throw up (vomit). After a period of 3 to 5 days, these symptoms begin to improve. Sometimes improvement may take longer. Eventually, the headaches will slowly improve with treatment with the right medications. Most people are able to stop using analgesic medications at home with a caregiver's supervision. But some find it difficult and may require hospitalization. Document Released: 12/10/2003 Document Revised: 12/12/2011 Document Reviewed: 04/04/2013 [facility] Patient Information 2014 [place], [place].

## 2013-09-25 NOTE — Progress Notes (Signed)
[name] is a 47 y.o. female who presents today for daily HA.  Previously seen last by Dr. Durene Cal in April 2014, and has not presented to the clinic since then.  Today, patient returns and states headaches are now continuing on a daily basis. She states occasional use with naprosyn or ibuprofen but has not taken this today. Denies worst HA of life, scotoma, photophobia, N/V, dysphagia. Sumatriptan used before  and pain worsened temporarily once again before getting slightly better. HA still frontal, lasting longer than 4 hours. No unilateral tearing or runny nose. Some tightness in her neck. No fever/chills. Continues to only occasionally have coffee. No headache currently at this time.    No past medical history on file.  History  Smoking status  . Never Smoker   Smokeless tobacco  . Not on file    No family history on file.  Current Outpatient Prescriptions on File Prior to Visit  Medication Sig Dispense Refill  . benzonatate (TESSALON) 100 MG capsule Take 1 capsule (100 mg total) by mouth every 8 (eight) hours.  21 capsule  0  . dextromethorphan-guaiFENesin (MUCINEX DM) 30-600 MG per 12 hr tablet Take 1 tablet by mouth 2 (two) times daily.  20 tablet  0  . DM-Phenylephrine-Acetaminophen (VICKS DAYQUIL MULTI-SYMPTOM) 10-5-325 MG CAPS Take 1 capsule by mouth 2 (two) times daily as needed (for cough/cold symptoms).      Marland Kitchen ibuprofen (ADVIL,MOTRIN) 200 MG tablet Take 800 mg by mouth every 8 (eight) hours as needed for moderate pain.      . naproxen (NAPROSYN) 500 MG tablet Take 1 tablet (500 mg total) by mouth 2 (two) times daily.  30 tablet  0   No current facility-administered medications on file prior to visit.    ROS: Per HPI.  All other systems reviewed and are negative.   Physical Exam Filed Vitals:   09/25/13 1020  BP: 132/82  Pulse: 79  Temp: 99.1 F (37.3 C)    Physical Examination: General appearance - alert, well appearing, and in no distress Neuro: CN II-XII  intact, sensation and reflexes normal throughout. Normal finger to nose.  Negative Romberg, negative Pronator Drift, MS 5/5 BL UE/LE     Chemistry      Component Value Date/Time   NA 140 07/02/2012 0828   K 4.0 07/02/2012 0828   CL 103 07/02/2012 0828   CO2 30 07/02/2012 0828   BUN 15 07/02/2012 0828   CREATININE 0.67 07/02/2012 0828      Component Value Date/Time   CALCIUM 9.4 07/02/2012 0828   ALKPHOS 59 07/02/2012 0828   AST 21 07/02/2012 0828   ALT 8 07/02/2012 0828   BILITOT 0.6 07/02/2012 1610

## 2013-11-25 ENCOUNTER — Encounter: Payer: Self-pay | Admitting: Family Medicine

## 2013-11-25 ENCOUNTER — Ambulatory Visit (INDEPENDENT_AMBULATORY_CARE_PROVIDER_SITE_OTHER): Payer: No Typology Code available for payment source | Admitting: Family Medicine

## 2013-11-25 ENCOUNTER — Ambulatory Visit: Payer: No Typology Code available for payment source | Admitting: Family Medicine

## 2013-11-25 ENCOUNTER — Ambulatory Visit (HOSPITAL_BASED_OUTPATIENT_CLINIC_OR_DEPARTMENT_OTHER)
Admission: RE | Admit: 2013-11-25 | Discharge: 2013-11-25 | Disposition: A | Discharge status: Home / Self Care | Payer: No Typology Code available for payment source | Source: Ambulatory Visit | Attending: Family Medicine | Admitting: Family Medicine

## 2013-11-25 VITALS — BP 130/84 | HR 79 | Ht 62.0 in | Wt 193.6 lb

## 2013-11-25 DIAGNOSIS — M25551 Pain in right hip: Secondary | ICD-10-CM

## 2013-11-25 DIAGNOSIS — M25559 Pain in unspecified hip: Secondary | ICD-10-CM

## 2013-11-25 NOTE — Patient Instructions (Signed)
You have trochanteric bursitis Icing 3-4 times a day 15 minutes at a time Aleve 2 tabs twice a day with food for pain and inflammation. You were given a cortisone shot today. Avoid painful activities when possible Avoid lying on affected side Consider physical therapy and home exercise program.   Stretches - pick 2, hold for 20 seconds and do 3 times at least once a day Follow up in 1 month for reevaluation

## 2013-11-29 ENCOUNTER — Encounter: Payer: Self-pay | Admitting: Family Medicine

## 2013-11-29 DIAGNOSIS — M25551 Pain in right hip: Secondary | ICD-10-CM | POA: Insufficient documentation

## 2013-11-29 NOTE — Assessment & Plan Note (Signed)
consistent with trochanteric bursitis.  Icing, nsaids, home exercise program and stretches reviewed.  Cortisone injection given as well.  Consider formal physical therapy if not improving.  F/u in 1 month.  After informed written consent patient was lying on left side on exam table.  Area overlying right greater trochanter prepped with alcohol swab then trochanteric bursa injected with 6:2 marcaine: depomedrol.  Patient tolerated procedure well without immediate complications.

## 2013-11-29 NOTE — Progress Notes (Signed)
Patient ID: [name], female   DOB: 12/17/1965, 48 y.o.   MRN: [id number]  PCP: Tana ConchHUNTER, STEPHEN, MD  Subjective:   HPI: Patient is a 48 y.o. female here for right hip pain.  Interpreter line used (french). Patient denies known injury. She states on Thursday or Friday night she woke up with pain lateral right hip. Can't lie down on this side. Hard to walk around because of pain. No swelling, bruising. No radiation. No numbness or tingling. No back pain. Taking ibuprofen. Hip radiographs normal.  History reviewed. No pertinent past medical history.  No current outpatient prescriptions on file prior to visit.   No current facility-administered medications on file prior to visit.    History reviewed. No pertinent past surgical history.  No Known Allergies  History   Social History  . Marital Status: Married    Spouse Name: N/A    Number of Children: N/A  . Years of Education: N/A   Occupational History  . Not on file.   Social History Main Topics  . Smoking status: Never Smoker   . Smokeless tobacco: Not on file  . Alcohol Use: No  . Drug Use: Not on file  . Sexual Activity: Yes   Other Topics Concern  . Not on file   Social History Narrative   Originally from [place] been in [place]. [place] is second language. Unsure of first language. Speaks some/limited English.       Has 6 kids, 3 live with her. Lives with current boyfriend.     Family History  Problem Relation Age of Onset  . Sudden death Neg Hx   . Hypertension Neg Hx   . Hyperlipidemia Neg Hx   . Heart attack Neg Hx   . Diabetes Neg Hx     BP 130/84  Pulse 79  Ht 5\' 2"  (1.575 m)  Wt 193 lb 9.6 oz (87.816 kg)  BMI 35.40 kg/m2  Review of Systems: See HPI above.    Objective:  Physical Exam:  Gen: NAD  Right hip/Back: No gross deformity, scoliosis. TTP right greater trochanter and just posterior to this.  No midline or bony TTP. FROM with pain on hip abduction, ext  rotation. Strength 4/5 hip abduction with pain.  Otherwise strength LEs 5/5 all muscle groups.   2+ MSRs in patellar and achilles tendons, equal bilaterally. Negative SLRs. Sensation intact to light touch bilaterally. Negative logroll bilateral hips Negative fabers and piriformis stretches.    Assessment & Plan:  1. Right hip pain - consistent with trochanteric bursitis.  Icing, nsaids, home exercise program and stretches reviewed.  Cortisone injection given as well.  Consider formal physical therapy if not improving.  F/u in 1 month.  After informed written consent patient was lying on left side on exam table.  Area overlying right greater trochanter prepped with alcohol swab then trochanteric bursa injected with 6:2 marcaine: depomedrol.  Patient tolerated procedure well without immediate complications.

## 2013-12-23 ENCOUNTER — Encounter: Payer: Self-pay | Admitting: Family Medicine

## 2013-12-23 ENCOUNTER — Ambulatory Visit (INDEPENDENT_AMBULATORY_CARE_PROVIDER_SITE_OTHER): Payer: No Typology Code available for payment source | Admitting: Family Medicine

## 2013-12-23 VITALS — BP 117/79 | HR 82 | Ht 61.0 in | Wt 196.2 lb

## 2013-12-23 DIAGNOSIS — M545 Low back pain, unspecified: Secondary | ICD-10-CM

## 2013-12-23 DIAGNOSIS — M25551 Pain in right hip: Secondary | ICD-10-CM

## 2013-12-23 DIAGNOSIS — M25559 Pain in unspecified hip: Secondary | ICD-10-CM

## 2013-12-23 NOTE — Patient Instructions (Signed)
You have musculoskeletal low back pain Take tylenol for baseline pain relief (1-2 extra strength tabs 3x/day) Aleve 2 tabs twice a day with food for pain and inflammation for 1 week then as needed. Stay as active as possible. Do home exercises and stretches as directed - hold each for 20-30 seconds and do each one three times. Consider massage, chiropractor, physical therapy, and/or acupuncture. Physical therapy has been shown to be helpful while the others have mixed results. Strengthening of low back muscles, abdominal musculature are key for long term pain relief. Follow up with me in 6 weeks or as needed.

## 2013-12-25 ENCOUNTER — Encounter: Payer: Self-pay | Admitting: Family Medicine

## 2013-12-25 DIAGNOSIS — M545 Low back pain, unspecified: Secondary | ICD-10-CM | POA: Insufficient documentation

## 2013-12-25 NOTE — Progress Notes (Signed)
Patient ID: [name], female   DOB: 07/19/1966, 48 y.o.   MRN: [id number]  PCP: Tana ConchHUNTER, STEPHEN, MD  Subjective:   HPI: Patient is a 48 y.o. female here for right hip pain.  2/23: Interpreter line used (french). Patient denies known injury. She states on Thursday or Friday night she woke up with pain lateral right hip. Can't lie down on this side. Hard to walk around because of pain. No swelling, bruising. No radiation. No numbness or tingling. No back pain. Taking ibuprofen. Hip radiographs normal.  3/23: Interpreter present for visit. Patient reports no longer has pain in right hip following injection, home exercises/stretches. Has some pain in low back worse when lying down and with prolonged standing. No radiation. No numbness/tingling. No bowel/bladder dysfunction. Not taking or doing anything for the back.  History reviewed. No pertinent past medical history.  No current outpatient prescriptions on file prior to visit.   No current facility-administered medications on file prior to visit.    History reviewed. No pertinent past surgical history.  No Known Allergies  History   Social History  . Marital Status: Married    Spouse Name: N/A    Number of Children: N/A  . Years of Education: N/A   Occupational History  . Not on file.   Social History Main Topics  . Smoking status: Never Smoker   . Smokeless tobacco: Not on file  . Alcohol Use: No  . Drug Use: Not on file  . Sexual Activity: Yes   Other Topics Concern  . Not on file   Social History Narrative   Originally from [place] been in [place]. [place] is second language. Unsure of first language. Speaks some/limited English.       Has 6 kids, 3 live with her. Lives with current boyfriend.     Family History  Problem Relation Age of Onset  . Sudden death Neg Hx   . Hypertension Neg Hx   . Hyperlipidemia Neg Hx   . Heart attack Neg Hx   . Diabetes Neg Hx     BP 117/79   Pulse 82  Ht 5\' 1"  (1.549 m)  Wt 196 lb 3.2 oz (88.996 kg)  BMI 37.09 kg/m2  Review of Systems: See HPI above.    Objective:  Physical Exam:  Gen: NAD  Right hip/Back: No gross deformity, scoliosis. No trochanter tenderness.  Minimal paraspinal lumbar tenderness bilaterally. No midline or bony TTP. FROM without pain in hip.  Mild pain with flexion in back. Strength 4+/5 hip abduction without pain.  Otherwise strength LEs 5/5 all muscle groups.   2+ MSRs in patellar and achilles tendons, equal bilaterally. Negative SLRs. Sensation intact to light touch bilaterally. Negative logroll bilateral hips Negative fabers and piriformis stretches.    Assessment & Plan:  1. Right hip pain - consistent with trochanteric bursitis.  Improved with injection.  Continue with home exercises until strength is full.  Icing and nsaids only if needed.  2. Low back pain - 2/2 muscle strain.  Tylenol, nsaids as needed.  Shown home exercises and stretches for back to do regularly.  Consider physical therapy.  F/u in 6 weeks or prn.

## 2013-12-25 NOTE — Assessment & Plan Note (Signed)
consistent with trochanteric bursitis.  Improved with injection.  Continue with home exercises until strength is full.  Icing and nsaids only if needed.

## 2013-12-25 NOTE — Assessment & Plan Note (Signed)
2/2 muscle strain.  Tylenol, nsaids as needed.  Shown home exercises and stretches for back to do regularly.  Consider physical therapy.  F/u in 6 weeks or prn.

## 2014-05-06 ENCOUNTER — Encounter: Payer: Self-pay | Admitting: Family Medicine

## 2014-05-06 ENCOUNTER — Ambulatory Visit (INDEPENDENT_AMBULATORY_CARE_PROVIDER_SITE_OTHER): Payer: Self-pay | Admitting: Family Medicine

## 2014-05-06 ENCOUNTER — Other Ambulatory Visit (HOSPITAL_COMMUNITY)
Admission: RE | Admit: 2014-05-06 | Discharge: 2014-05-06 | Disposition: A | Discharge status: Home / Self Care | Payer: No Typology Code available for payment source | Source: Ambulatory Visit | Attending: Family Medicine | Admitting: Family Medicine

## 2014-05-06 VITALS — BP 138/87 | HR 74 | Temp 98.0°F | Wt 195.0 lb

## 2014-05-06 DIAGNOSIS — Z124 Encounter for screening for malignant neoplasm of cervix: Secondary | ICD-10-CM

## 2014-05-06 DIAGNOSIS — Z1151 Encounter for screening for human papillomavirus (HPV): Secondary | ICD-10-CM | POA: Insufficient documentation

## 2014-05-06 DIAGNOSIS — Z0000 Encounter for general adult medical examination without abnormal findings: Secondary | ICD-10-CM

## 2014-05-06 NOTE — Progress Notes (Deleted)
   [name] Kegg is a 48 y.o. female who [facility] ***. Her concerns today include:  HPI:  History Reviewed: *** smoker.  Health Maintenance: ***  No past medical history on file.  ROS as above.    Medications reviewed. No current outpatient prescriptions on file.   No current facility-administered medications for this visit.    Exam: *** BP 138/87  Pulse 74  Temp(Src) 98 F (36.7 C) (Oral)  Wt 195 lb (88.451 kg) Gen: Well NAD HEENT: EOMI,  MMM Lungs: CTABL Nl WOB Heart: RRR no MRG Abd: NABS, NT, ND Exts: Non edematous BL  LE, warm and well perfused.   No results found for this or any previous visit (from the past 72 hour(s)).  A/P: See problem list  No problem-specific assessment & plan notes found for this encounter.   Katina Degreealeb M. Jimmey RalphParker, MD [facility] Resident PGY-1 05/06/2014 8:39 AM    Subjective:     [name] Desautel is a 48 y.o. female and is here for a comprehensive physical exam. The patient reports {problems:16946}.  History   Social History  . Marital Status: Married    Spouse Name: N/A    Number of Children: N/A  . Years of Education: N/A   Occupational History  . Not on file.   Social History Main Topics  . Smoking status: Never Smoker   . Smokeless tobacco: Not on file  . Alcohol Use: No  . Drug Use: Not on file  . Sexual Activity: Yes   Other Topics Concern  . Not on file   Social History Narrative   Originally from [place] been [place]. [place] is second language. Unsure of first language. Speaks some/limited English.       Has 6 kids, 3 live with her. Lives with current boyfriend.    Health Maintenance  Topic Date Due  . Pap Smear  02/06/2014  . Influenza Vaccine  05/03/2014  . Tetanus/tdap  06/26/2022    {Common ambulatory SmartLinks:19316}  Review of Systems {ros; complete:30496}   Objective:    {Exam, Complete:[id number]}    Assessment:    Healthy female exam. ***       Plan:     See After Visit Summary for Counseling Recommendations

## 2014-05-06 NOTE — Progress Notes (Signed)
  Subjective:     [name] is a 48 y.o. female and is here for a comprehensive physical exam. The patient reports having headaches frequently, but is usually controlled with OTC analgesics.   History   Social History  . Marital Status: Married    Spouse Name: N/A    Number of Children: N/A  . Years of Education: N/A   Occupational History  . Not on file.   Social History Main Topics  . Smoking status: Never Smoker   . Smokeless tobacco: Not on file  . Alcohol Use: No  . Drug Use: Not on file  . Sexual Activity: Yes   Other Topics Concern  . Not on file   Social History Narrative   Originally from [place] been [place]. [place] is second language. Unsure of first language. Speaks some/limited English.       Has 6 kids, 3 live with her. Lives with current boyfriend.    Health Maintenance  Topic Date Due  . Pap Smear  02/06/2014  . Influenza Vaccine  05/03/2014  . Tetanus/tdap  06/26/2022    The following portions of the patient's history were reviewed and updated as appropriate: allergies, current medications, past family history, past medical history, past social history, past surgical history and problem list.  Review of Systems No fevers, chills, or weight loss. No chest pain or shortness of breath. No nausea, vomiting, or diarrhea. No dysuria. No edema.  Objective:    General appearance: alert, cooperative and no distress Head: Normocephalic, without obvious abnormality, atraumatic Lungs: clear to auscultation bilaterally Heart: regular rate and rhythm, S1, S2 normal, no murmur, click, rub or gallop Abdomen: soft, non-tender; bowel sounds normal; no masses,  no organomegaly Pelvic: cervix normal in appearance, external genitalia normal and vagina normal without discharge Skin: Skin color, texture, turgor normal. No rashes or lesions Neurologic: Alert and oriented X 3, normal strength and tone. Normal symmetric reflexes. Normal coordination and  gait    Assessment:    Healthy female exam.      Plan:   Pap today. Otherwise doing well and up to date on all other screening.   See After Visit Summary for Counseling Recommendations

## 2014-05-06 NOTE — Patient Instructions (Addendum)
Thank you for coming to clinic today. I am glad you are doing well. We did a pap smear today, we will contact you with the results. You will have a repeat pap in 3 years. Please come in next year for your annual exam, or sooner for any other problems.

## 2014-05-07 LAB — CYTOLOGY - PAP

## 2014-05-08 ENCOUNTER — Encounter: Payer: Self-pay | Admitting: Family Medicine

## 2014-05-13 ENCOUNTER — Encounter: Payer: Self-pay | Admitting: Family Medicine

## 2014-05-13 ENCOUNTER — Ambulatory Visit (INDEPENDENT_AMBULATORY_CARE_PROVIDER_SITE_OTHER): Payer: Self-pay | Admitting: Family Medicine

## 2014-05-13 VITALS — BP 135/88 | HR 71 | Wt 190.0 lb

## 2014-05-13 DIAGNOSIS — M25512 Pain in left shoulder: Secondary | ICD-10-CM

## 2014-05-13 DIAGNOSIS — M25519 Pain in unspecified shoulder: Secondary | ICD-10-CM

## 2014-05-13 MED ORDER — METHYLPREDNISOLONE ACETATE 40 MG/ML IJ SUSP
40.0000 mg | Freq: Once | INTRAMUSCULAR | Status: AC
  Administered 2014-05-13: 40 mg via INTRA_ARTICULAR

## 2014-05-13 NOTE — Patient Instructions (Signed)
You have rotator cuff impingement Try to avoid painful activities (overhead activities, lifting with extended arm) as much as possible. Aleve 2 tabs twice a day with food OR ibuprofen 3 tabs three times a day with food for pain and inflammation. Can take tylenol in addition to this. Subacromial injection may be beneficial to help with pain and to decrease inflammation - you were given this today. Consider physical therapy with transition to home exercise program. Do home exercise program with theraband and scapular stabilization exercises daily - these are very important for long term relief even if an injection was given. If not improving at follow-up we will consider referral to orthopedic surgery.

## 2014-05-15 ENCOUNTER — Other Ambulatory Visit: Payer: Self-pay | Admitting: *Deleted

## 2014-05-15 ENCOUNTER — Encounter: Payer: Self-pay | Admitting: Family Medicine

## 2014-05-15 ENCOUNTER — Ambulatory Visit: Payer: No Typology Code available for payment source | Admitting: Family Medicine

## 2014-05-15 ENCOUNTER — Encounter: Payer: Self-pay | Admitting: *Deleted

## 2014-05-15 ENCOUNTER — Telehealth: Payer: Self-pay | Admitting: Family Medicine

## 2014-05-15 DIAGNOSIS — M25512 Pain in left shoulder: Secondary | ICD-10-CM | POA: Insufficient documentation

## 2014-05-15 MED ORDER — TRAMADOL HCL 50 MG PO TABS: ORAL_TABLET | ORAL | Status: DC

## 2014-05-15 NOTE — Telephone Encounter (Signed)
Ok to write letter excusing her from work today and tomorrow.  Thanks!

## 2014-05-15 NOTE — Progress Notes (Addendum)
Patient ID: [name], female   DOB: 12/29/1965, 48 y.o.   MRN: [id number]  PCP: Jacquiline DoeParker, Caleb, MD  Subjective:   HPI: Patient is a 48 y.o. female here for Left shoulder pain.  Patient was last seen April 2014 for this issue - has known chronic rotator cuff tendinopathy confirmed by MRI. She did physical therapy without much improvement. Then had a cortisone injection which did help some. States pain became really severe again past couple days. No new injuries or increase in activity level. No swelling. Motion worse now.  History reviewed. No pertinent past medical history.  No current outpatient prescriptions on file prior to visit.   No current facility-administered medications on file prior to visit.    History reviewed. No pertinent past surgical history.  No Known Allergies  History   Social History  . Marital Status: Married    Spouse Name: N/A    Number of Children: N/A  . Years of Education: N/A   Occupational History  . Not on file.   Social History Main Topics  . Smoking status: Never Smoker   . Smokeless tobacco: Not on file  . Alcohol Use: No  . Drug Use: Not on file  . Sexual Activity: Yes   Other Topics Concern  . Not on file   Social History Narrative   Originally from [place] been [place]. [place] is second language. Unsure of first language. Speaks some/limited English.       Has 6 kids, 3 live with her. Lives with current boyfriend.     Family History  Problem Relation Age of Onset  . Sudden death Neg Hx   . Hypertension Neg Hx   . Hyperlipidemia Neg Hx   . Heart attack Neg Hx   . Diabetes Neg Hx     BP 135/88  Pulse 71  Wt 190 lb (86.183 kg)  Review of Systems: See HPI above.    Objective:  Physical Exam:  Gen: NAD  Left shoulder: No swelling, ecchymoses.  No gross deformity. No TTP. FROM with painful arc. Positive Hawkins, Neers. Negative Speeds, Yergasons. Strength 4/5 with empty can, painful.   5/5 resisted internal/external rotation.  Negative apprehension. NV intact distally.    Assessment & Plan:  1. Left shoulder pain - 2/2 rotator cuff tendinopathy, impingement.  Will repeat injection today as she did have benefit last time.  Can consider repeating physical therapy, nitro patches, orthopedic referral if not improving as expected.  Restart home exercise program in 5-7 days.  F/u in 5-6 weeks.  After informed written consent, patient was seated on exam table. Left shoulder was prepped with alcohol swab and utilizing posterior approach, patient's left glenohumeral space was injected with 3:1 marcaine: depomedrol. Patient tolerated the procedure well without immediate complications.

## 2014-05-15 NOTE — Assessment & Plan Note (Signed)
2/2 rotator cuff tendinopathy, impingement.  Will repeat injection today as she did have benefit last time.  Can consider repeating physical therapy, nitro patches, orthopedic referral if not improving as expected.  Restart home exercise program in 5-7 days.  F/u in 5-6 weeks.  After informed written consent, patient was seated on exam table. Left shoulder was prepped with alcohol swab and utilizing posterior approach, patient's left glenohumeral space was injected with 3:1 marcaine: depomedrol. Patient tolerated the procedure well without immediate complications.

## 2014-05-16 ENCOUNTER — Encounter: Payer: Self-pay | Admitting: Family Medicine

## 2014-05-16 ENCOUNTER — Ambulatory Visit (INDEPENDENT_AMBULATORY_CARE_PROVIDER_SITE_OTHER): Payer: No Typology Code available for payment source | Admitting: Family Medicine

## 2014-05-16 VITALS — BP 131/88 | HR 102 | Temp 97.0°F

## 2014-05-16 DIAGNOSIS — M25519 Pain in unspecified shoulder: Secondary | ICD-10-CM

## 2014-05-16 DIAGNOSIS — M25512 Pain in left shoulder: Secondary | ICD-10-CM

## 2014-05-16 MED ORDER — OXYCODONE-ACETAMINOPHEN 5-325 MG PO TABS: 1.0000 | ORAL_TABLET | Freq: Four times a day (QID) | ORAL | Status: DC | PRN

## 2014-05-16 MED ORDER — IBUPROFEN 600 MG PO TABS: 600.0000 mg | ORAL_TABLET | Freq: Three times a day (TID) | ORAL | Status: DC | PRN

## 2014-05-16 NOTE — Patient Instructions (Signed)
Take ibuprofen 600mg  three times a day with food. Percocet as needed for severe pain 4 times a day. Call me Tuesday or Wednesday to let me know how you're doing - the shot should have kicked in by then. If not improving would consider an MRI. I suspect your pain is due to frozen shoulder though which is typically this painful.

## 2014-05-20 ENCOUNTER — Encounter: Payer: Self-pay | Admitting: Family Medicine

## 2014-05-20 NOTE — Progress Notes (Signed)
Patient ID: [name], female   DOB: 08/26/1966, 48 y.o.   MRN: [id number]  PCP: Jacquiline DoeParker, Caleb, MD  Subjective:   HPI: Patient is a 48 y.o. female here for Left shoulder pain.  8/11: Patient was last seen April 2014 for this issue - has known chronic rotator cuff tendinopathy confirmed by MRI. She did physical therapy without much improvement. Then had a cortisone injection which did help some. States pain became really severe again past couple days. No new injuries or increase in activity level. No swelling. Motion worse now.  8/14: Patient returns as shoulder feels worse than last visit. No fever noted today. Has not had redness, rash near injection site. Difficulty moving arm.  History reviewed. No pertinent past medical history.  No current outpatient prescriptions on file prior to visit.   No current facility-administered medications on file prior to visit.    History reviewed. No pertinent past surgical history.  No Known Allergies  History   Social History  . Marital Status: Married    Spouse Name: N/A    Number of Children: N/A  . Years of Education: N/A   Occupational History  . Not on file.   Social History Main Topics  . Smoking status: Never Smoker   . Smokeless tobacco: Not on file  . Alcohol Use: No  . Drug Use: Not on file  . Sexual Activity: Yes   Other Topics Concern  . Not on file   Social History Narrative   Originally from [place]. [place] been [place]. [place] is second language. Unsure of first language. Speaks some/limited English.       Has 6 kids, 3 live with her. Lives with current boyfriend.     Family History  Problem Relation Age of Onset  . Sudden death Neg Hx   . Hypertension Neg Hx   . Hyperlipidemia Neg Hx   . Heart attack Neg Hx   . Diabetes Neg Hx     BP 131/88  Pulse 102  Temp(Src) 97 F (36.1 C)  Review of Systems: See HPI above.    Objective:  Physical Exam:  Gen: NAD  Left shoulder: No  swelling, ecchymoses.  No gross deformity. No TTP. Pain with all motions of shoulder.  Abduction, flexion to 90 degrees, painful, ER to 50 degrees. Positive Hawkins, Neers. Negative apprehension. NV intact distally.  MSK u/s:  Subscapularis, supraspinatus, and infraspinatus all appear intact on long and trans views.  No tears or retraction.    Assessment & Plan:  1. Left shoulder pain - 2/2 rotator cuff tendinopathy, impingement with developing adhesive capsulitis. Advised patient this is typically when injection will start to give improvement.  She has known tendinopathy of this shoulder dating back several years with MRI confirming this.  Ultrasound does not show a tear in rotator cuff following injection.  No fever, rash to suggest septic joint.  Rx oxycodone and ibuprofen.  Can consider repeating her imaging if not improving over next couple weeks.  Call if she develops a fever > 100.4.

## 2014-05-20 NOTE — Assessment & Plan Note (Signed)
2/2 rotator cuff tendinopathy, impingement with developing adhesive capsulitis. Advised patient this is typically when injection will start to give improvement.  She has known tendinopathy of this shoulder dating back several years with MRI confirming this.  Ultrasound does not show a tear in rotator cuff following injection.  No fever, rash to suggest septic joint.  Rx oxycodone and ibuprofen.  Can consider repeating her imaging if not improving over next couple weeks.  Call if she develops a fever > 100.4.

## 2014-09-17 ENCOUNTER — Encounter: Payer: Self-pay | Admitting: Sports Medicine

## 2014-09-17 ENCOUNTER — Ambulatory Visit (INDEPENDENT_AMBULATORY_CARE_PROVIDER_SITE_OTHER): Payer: No Typology Code available for payment source | Admitting: Sports Medicine

## 2014-09-17 VITALS — BP 114/80 | Ht 61.0 in | Wt 196.0 lb

## 2014-09-17 DIAGNOSIS — M545 Low back pain, unspecified: Secondary | ICD-10-CM

## 2014-09-17 DIAGNOSIS — M25561 Pain in right knee: Secondary | ICD-10-CM

## 2014-09-17 MED ORDER — METHYLPREDNISOLONE ACETATE 40 MG/ML IJ SUSP
40.0000 mg | Freq: Once | INTRAMUSCULAR | Status: AC
  Administered 2014-09-17: 40 mg via INTRA_ARTICULAR

## 2014-09-17 MED ORDER — DICLOFENAC SODIUM 75 MG PO TBEC: DELAYED_RELEASE_TABLET | ORAL | Status: DC

## 2014-09-18 NOTE — Progress Notes (Signed)
   Subjective:    Patient ID: [name], female    DOB: 11/27/1965, 48 y.o.   MRN: [id number]  HPI chief complaint: Low back and right knee pain  Patient comes in today complaining of both low back pain and right knee pain. Pain in both areas have been present now for about 2 weeks. No trauma. Pain in the low back is diffuse and worse with activity. Improves at rest. No radiating pain. No associated numbness or tingling. No prior low back surgery. No groin pain. Right knee pain is diffuse. No swelling. She has had cortisone injections in this knee in the past with good results. No mechanical symptoms. Pain here improves at rest as well. She has been taking some over-the-counter Aleve with some benefit. She is here today with a friend of hers who acts as an interpreter  Interim medical history is reviewed Medications reviewed Allergies reviewed    Review of Systems     Objective:   Physical Exam Obese. No acute distress. Awake alert and oriented 3. Vital signs reviewed  Lumbar spine: Limited range of motion secondary to pain. Diffuse tenderness to palpation along the lumbosacral area but nothing focal. No tenderness to palpation or percussion along the lumbar midline. Negative straight leg raise bilaterally. Negative log roll bilaterally. No focal neurological deficit of either lower extremity.  Right knee: Range of motion is 0-120. 1+ boggy synovitis. 3+ patellofemoral crepitus. No joint line tenderness. Negative McMurray's. Knee is grossly stable to ligamentous exam. Neurovascular intact distally. Walking with a slight limp.       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 Patient agrees. Anterior lateral approach was utilized. Patient will start Voltaren 75 mg twice daily for 7 days then when necessary. She will stop the Aleve. Follow-up in 2-3 weeks. If pain persists  either in the low back or in the knee we will consider getting plain x-rays at follow-up. We could consider physical therapy for her low back as well. Call with questions or concerns in the interim.  Consent obtained and verified. Time-out conducted. Noted no overlying erythema, induration, or other signs of local infection. Skin prepped in a sterile fashion. Topical analgesic spray: Ethyl chloride. Joint: right knee Needle: 25g 1.5 inch Completed without difficulty. Meds: 3cc 1% xylocaine, 1cc (40mg ) depomedrol  Advised to call if fevers/chills, erythema, induration, drainage, or persistent bleeding.

## 2014-10-08 ENCOUNTER — Encounter: Payer: Self-pay | Admitting: Sports Medicine

## 2014-10-08 ENCOUNTER — Ambulatory Visit (INDEPENDENT_AMBULATORY_CARE_PROVIDER_SITE_OTHER): Payer: No Typology Code available for payment source | Admitting: Sports Medicine

## 2014-10-08 ENCOUNTER — Ambulatory Visit
Admission: RE | Admit: 2014-10-08 | Discharge: 2014-10-08 | Disposition: A | Discharge status: Home / Self Care | Payer: No Typology Code available for payment source | Source: Ambulatory Visit | Attending: Sports Medicine | Admitting: Sports Medicine

## 2014-10-08 VITALS — BP 118/88 | HR 66 | Ht 61.0 in | Wt 196.0 lb

## 2014-10-08 DIAGNOSIS — M25561 Pain in right knee: Secondary | ICD-10-CM

## 2014-10-08 NOTE — Patient Instructions (Addendum)
Pt interpreter was Amina Tahirou

## 2014-10-09 NOTE — Progress Notes (Signed)
   Subjective:    Patient ID: [name], female    DOB: 08/18/1966, 49 y.o.   MRN: [id number]  HPI  Patient comes in today for follow-up on right knee pain. Pain has improved after recent cortisone injection. Still some mild discomfort along the medial knee not severe. She took Voltaren for 1 week and did notice some symptom relief. She is not currently taking it. Please note that the history is obtained with the help of an interpreter.    Review of Systems     Objective:   Physical Exam Well-developed, well-nourished. No acute distress. Awake alert and oriented 3. Vital signs reviewed.  Right knee: Range of motion is 0-120. 1+ boggy synovitis. 3+ patellofemoral crepitus. There is some tenderness to palpation along the medial joint line but a negative McMurray's. Mild valgus deformity. Neurovascularly intact distally. Walking with a slight limp.       Assessment & Plan:  Right knee pain likely secondary to DJD  Although the patient's knee pain has improved it has not resolved. I recommended that we get x-rays of her knee and have her return to the office in 2-3 weeks. We will discuss further treatment at that time. If x-ray show significant degenerative change then definitive treatment may be in the form of total knee arthroplasty. In the meantime, I've encouraged her to resume using her Voltaren on a when necessary basis.

## 2014-10-22 ENCOUNTER — Ambulatory Visit (INDEPENDENT_AMBULATORY_CARE_PROVIDER_SITE_OTHER): Payer: No Typology Code available for payment source | Admitting: Sports Medicine

## 2014-10-22 ENCOUNTER — Encounter: Payer: Self-pay | Admitting: Sports Medicine

## 2014-10-22 VITALS — BP 142/93 | Ht 61.0 in | Wt 196.0 lb

## 2014-10-22 DIAGNOSIS — M25561 Pain in right knee: Secondary | ICD-10-CM

## 2014-10-22 NOTE — Progress Notes (Signed)
Interpretor for the day is boukari saidou

## 2014-10-22 NOTE — Progress Notes (Signed)
   Subjective:    Patient ID: [name], female    DOB: 06/27/1966, 49 y.o.   MRN: [id number]  HPI Patient comes in today for follow-up. Recent x-rays of the right knee showed primarily patellofemoral DJD. Some mild medial compartmental narrowing as well. She has done well with a recent cortisone injection. Voltaren was not of much help. Aleve seems to work better for her. She has no complaints today. She is here today with an interpreter.   Review of Systems     Objective:   Physical Exam Well-developed, well-nourished. No acute distress.  Right knee: Full range of motion. 1+ boggy synovitis. 3+ patellofemoral crepitus. Good joint stability. Neurovascular intact distally.  X-rays are as above      Assessment & Plan:  Improved right knee pain secondary to patellofemoral DJD  Since the patient is feeling better we will hold on further treatment at this point in time. She can resume using her Aleve on an as-needed basis. I can reinject this knee in a few months if pain returns. Follow-up when necessary.

## 2015-02-02 ENCOUNTER — Encounter: Payer: Self-pay | Admitting: Sports Medicine

## 2015-02-02 ENCOUNTER — Ambulatory Visit (INDEPENDENT_AMBULATORY_CARE_PROVIDER_SITE_OTHER): Payer: Self-pay | Admitting: Sports Medicine

## 2015-02-02 VITALS — BP 112/69 | Ht 61.0 in | Wt 196.0 lb

## 2015-02-02 DIAGNOSIS — M1711 Unilateral primary osteoarthritis, right knee: Secondary | ICD-10-CM

## 2015-02-02 DIAGNOSIS — M25512 Pain in left shoulder: Secondary | ICD-10-CM

## 2015-02-02 MED ORDER — METHYLPREDNISOLONE ACETATE 40 MG/ML IJ SUSP
40.0000 mg | Freq: Once | INTRAMUSCULAR | Status: AC
  Administered 2015-02-02: 40 mg via INTRA_ARTICULAR

## 2015-02-02 MED ORDER — IBUPROFEN 600 MG PO TABS: 600.0000 mg | ORAL_TABLET | Freq: Three times a day (TID) | ORAL | Status: DC | PRN

## 2015-02-02 NOTE — Patient Instructions (Signed)
Nice to see you. We injected your left shoulder and right knee.  We refilled your ibuprofen.  If you develop worsening pain, redness, warmth of the joints, or fever please let us know.

## 2015-02-02 NOTE — Progress Notes (Signed)
Subjective:    Patient ID: [name], female    DOB: 03/22/1966, 49 y.o.   MRN: [id number]  HPI Patient is a 49 yo female presenting for left shoulder pain and right knee pain.  Left shoulder pain recurred 1 month ago. Has a history of MRI proven rotator cuff tendinopathy in 2013. Notes discomfort came back one month ago. Is worse with any position. No weakness, numbness, or tingling. Notes is able to move left shoulder in all directions. Last had injection one year ago with some benefit. Has been taking ibuprofen for this with benefit.  Right knee pain has been an ongoing issue for 3 years. Notes has arthritis in right knee. No recent injury. Feels the pain came back after her last injection wore off. Notes pain is present all over her knee all the time. No erythema or fever. Last injection was December 2015.  Left hip discomfort has been an issue over the past year. Previously seen at sports medicine in [place] and had XR right hip and pelvis that revealed no apparent arthritic changes. States she was told she had arthritis. No injury to hip.  Review of Systems     Objective:   Physical Exam  Constitutional: She appears well-developed and well-nourished. No distress.  HENT:  Head: Normocephalic and atraumatic.  Musculoskeletal:  Left shoulder with no swelling or erythema, there is no tenderness to palpation of any aspect of the left shoulder, she has full ROM left shoulder with no discomfort, she has positive empty can testing on left with 4/5 strength limited by discomfort, negative speeds testing left shoulder, right shoulder with no swelling, erythema, tenderness, full ROM, negative empty can and speeds tests Right knee minimal swelling apparent, no warmth or erythema, no tenderness of joint lines, no ligamentous laxity, negative mcmurray, crepitus on extension of knee, left knee with no swelling, warmth, or erythema, no joint line tenderness, no ligamentous laxity, negative  mcmurray, crepitus present on extension of knee, full ROM bilateral knees Left hip with no tenderness or swelling, ROM intact, mild discomfort with internal rotation, right hip with no tenderness or swelling, ROM intact, no discomfort with ROM  Neurological:  5/5 strength in bilateral biceps, triceps, grip, quads, hamstrings, plantar and dorsiflexion, sensation to light touch intact in bilateral UE and LE, normal gait, 2+ patellar, biceps, and brachioradialis reflexes  Skin:  Bilateral upper and lower extremities warm and well perfused          Assessment & Plan:  Right knee OA Left rotator cuff tendinopathy  Patient with MRI proven left rotator cuff lesion and known right knee OA. Neurovaculary intact. Discomfort has returned to both of these areas. No signs of infection on exam of either joint. Injections for symptom management per below. Will refill ibuprofen as well. Given return precautions.   Subacromial Shoulder Injection Procedure Note  Pre-operative Diagnosis: left rotator cuff tendinopathy  Post-operative Diagnosis: normal  Indications: symptom relief  Anesthesia: ethyl chloride   Procedure Details   Written consent was obtained for the procedure. The shoulder was prepped with alcohol and the skin was anesthetized. A 25 gauge needle was advanced into the subacromial space through the skin initially meeting bony resistance and redirected to the subacromial space.  The space was then injected with 3 ml 1% xylocaine and 1 ml of depomedrol. Pressure was applied and then bandage applied.  Complications:  None; patient tolerated the procedure well.  Knee Injection Procedure Note  Pre-operative Diagnosis: right knee OA  Post-operative Diagnosis: normal  Indications: Symptom relief from osteoarthritis  Anesthesia: ethyl chloride   Procedure Details   Written consent was obtained for the procedure. The joint was prepped with alcohol and freeze spray used to anesthetize  the skin. A 22 gauge needle was inserted into the anterior aspect of the joint from a lateral approach.  3 ml 1% xylocaine and 1 ml of depomedrol was then injected into the joint. The needle was removed and pressure applied to the area and dressed.  Complications:  None; patient tolerated the procedure well.  Marikay Alar, MD [facility] PGY3   Patient was seen and evaluated along with the above resident. I agree with the above plan of care. Injections were performed by the resident under direct supervision from me. Patient will follow-up as needed.

## 2015-09-11 ENCOUNTER — Encounter: Payer: Self-pay | Admitting: Family Medicine

## 2015-09-24 ENCOUNTER — Ambulatory Visit: Payer: Self-pay

## 2015-12-25 ENCOUNTER — Encounter (HOSPITAL_COMMUNITY): Payer: Self-pay

## 2015-12-25 ENCOUNTER — Emergency Department (HOSPITAL_COMMUNITY)
Admission: EM | Admit: 2015-12-25 | Discharge: 2015-12-25 | Disposition: A | Discharge status: Home / Self Care | Payer: Self-pay | Attending: Emergency Medicine | Admitting: Emergency Medicine

## 2015-12-25 DIAGNOSIS — X500XXA Overexertion from strenuous movement or load, initial encounter: Secondary | ICD-10-CM | POA: Insufficient documentation

## 2015-12-25 DIAGNOSIS — Y9289 Other specified places as the place of occurrence of the external cause: Secondary | ICD-10-CM | POA: Insufficient documentation

## 2015-12-25 DIAGNOSIS — Y990 Civilian activity done for income or pay: Secondary | ICD-10-CM | POA: Insufficient documentation

## 2015-12-25 DIAGNOSIS — Y9389 Activity, other specified: Secondary | ICD-10-CM | POA: Insufficient documentation

## 2015-12-25 DIAGNOSIS — S39012A Strain of muscle, fascia and tendon of lower back, initial encounter: Secondary | ICD-10-CM | POA: Insufficient documentation

## 2015-12-25 MED ORDER — CYCLOBENZAPRINE HCL 10 MG PO TABS: 10.0000 mg | ORAL_TABLET | Freq: Two times a day (BID) | ORAL | Status: DC | PRN

## 2015-12-25 MED ORDER — HYDROCODONE-ACETAMINOPHEN 5-325 MG PO TABS: 1.0000 | ORAL_TABLET | Freq: Four times a day (QID) | ORAL | Status: DC | PRN

## 2015-12-25 NOTE — ED Notes (Signed)
Pt here with c/o back pain, onset yesterday. Pain worse with movement, denies abnormal urinary symptoms. She took motrin today with mild relief.

## 2015-12-25 NOTE — ED Notes (Signed)
Registration at bedside.

## 2015-12-25 NOTE — ED Notes (Signed)
Pt ambulatory and independent at discharge.  

## 2015-12-25 NOTE — Discharge Instructions (Signed)

## 2015-12-25 NOTE — ED Provider Notes (Signed)
CSN: [id number]     Arrival date & time 12/25/15  1901 History  By signing my name below, I, Doreatha Martinva Mathews, attest that this documentation has been prepared under the direction and in the presence of Roxy Horsemanobert Quincie Haroon, PA-C. Electronically Signed: Doreatha MartinEva Mathews, ED Scribe. 12/25/2015. 7:26 PM.     Chief Complaint  Patient presents with  . Back Pain   The history is provided by the patient. No language interpreter was used.   HPI Comments: [name] is a 50 y.o. female who presents to the Emergency Department with a chief complaint of moderate lower back pain onset last night. Pt denies recent trauma, injury, falls, bending. Per pt, she did a significant amount of heavy lifting with twisting motions at work prior to the onset of her pain. Pt states she has taken tylenol with no relief of pain. She reports that pain is worsened with movement and in certain positions. No h/o cancer, IVDU, back surgery. No h/o of similar symptoms. She denies bowel or bladder incontinence, saddle anesthesia, fever, cough, abdominal pain, dysuria, hematuria, frequency, rash. Denies numbness, focal weakness or paresthesia of the lower extremities.    History reviewed. No pertinent past medical history. History reviewed. No pertinent past surgical history. Family History  Problem Relation Age of Onset  . Sudden death Neg Hx   . Hypertension Neg Hx   . Hyperlipidemia Neg Hx   . Heart attack Neg Hx   . Diabetes Neg Hx    Social History  Substance Use Topics  . Smoking status: Never Smoker   . Smokeless tobacco: None  . Alcohol Use: No   OB History    No data available     Review of Systems  Constitutional: Negative for fever and chills.  Respiratory: Negative for cough.   Gastrointestinal: Negative for abdominal pain.       No bowel incontinence  Genitourinary: Negative for dysuria, frequency and hematuria.       No urinary incontinence  Musculoskeletal: Positive for myalgias, back pain and arthralgias.   Skin: Negative for rash.  Neurological: Negative for weakness and numbness.       No saddle anesthesia   Allergies  Review of patient's allergies indicates no known allergies.  Home Medications   Prior to Admission medications   Medication Sig Start Date End Date Taking? Authorizing Provider  diclofenac (VOLTAREN) 75 MG EC tablet Take one tablet twice a day with food for 7 days, then take as needed 09/17/14   Ozzie Hoyleimothy R Draper, DO  ibuprofen (ADVIL,MOTRIN) 600 MG tablet Take 1 tablet (600 mg total) by mouth every 8 (eight) hours as needed. 02/02/15   Glori LuisEric G Sonnenberg, MD  oxyCODONE-acetaminophen (PERCOCET/ROXICET) 5-325 MG per tablet Take 1 tablet by mouth every 6 (six) hours as needed for severe pain. 05/16/14   Lenda KelpShane R Hudnall, MD   BP 138/96 mmHg  Pulse 80  Temp(Src) 98.8 F (37.1 C) (Oral)  Resp 16  SpO2 96% Physical Exam  Constitutional: She is oriented to person, place, and time. She appears well-developed and well-nourished. No distress.  HENT:  Head: Normocephalic and atraumatic.  Eyes: Conjunctivae and EOM are normal. Right eye exhibits no discharge. Left eye exhibits no discharge. No scleral icterus.  Neck: Normal range of motion. Neck supple. No tracheal deviation present.  Cardiovascular: Normal rate, regular rhythm and normal heart sounds.  Exam reveals no gallop and no friction rub.   No murmur heard. Pulmonary/Chest: Effort normal and breath sounds normal. No respiratory distress.  She has no wheezes.  Abdominal: Soft. She exhibits no distension. There is no tenderness.  Musculoskeletal: Normal range of motion.  Lumbar paraspinal muscles tender to palpation, no bony tenderness, step-offs, or gross abnormality or deformity of spine, patient is able to ambulate, moves all extremities  Bilateral great toe extension intact Bilateral plantar/dorsiflexion intact  Neurological: She is alert and oriented to person, place, and time.  Sensation and strength intact  bilaterally   Skin: Skin is warm. She is not diaphoretic.  Psychiatric: She has a normal mood and affect. Her behavior is normal. Judgment and thought content normal.  Nursing note and vitals reviewed.   ED Course  Procedures (including critical care time) DIAGNOSTIC STUDIES: Oxygen Saturation is 96% on RA, adequate by my interpretation.    COORDINATION OF CARE: 7:25 PM Discussed treatment plan with pt at bedside which includes symptomatic therapy and pt agreed to plan.   MDM   Final diagnoses:  Lumbar strain, initial encounter    Patient with back pain.  No neurological deficits and normal neuro exam.  Patient is ambulatory.  No loss of bowel or bladder control.  Doubt cauda equina.  Denies fever,  doubt epidural abscess or other lesion. Recommend back exercises, stretching, RICE, and will treat with a short course of pain meds and muscle relaxer.  Encouraged the patient that there could be a need for additional workup and/or imaging such as MRI, if the symptoms do not resolve. Patient advised that if the back pain does not resolve, or radiates, this could progress to more serious conditions and is encouraged to follow-up with PCP or orthopedics within 2 weeks.     I personally performed the services described in this documentation, which was scribed in my presence. The recorded information has been reviewed and is accurate.     Roxy Horseman, PA-C 12/25/15 1930  Benjiman Core, MD 12/25/15 [phone]

## 2016-04-21 ENCOUNTER — Encounter: Payer: Self-pay | Admitting: Sports Medicine

## 2016-04-21 ENCOUNTER — Ambulatory Visit (INDEPENDENT_AMBULATORY_CARE_PROVIDER_SITE_OTHER): Payer: Self-pay | Admitting: Sports Medicine

## 2016-04-21 VITALS — BP 133/86 | HR 77 | Ht 61.0 in | Wt 200.0 lb

## 2016-04-21 DIAGNOSIS — M1711 Unilateral primary osteoarthritis, right knee: Secondary | ICD-10-CM | POA: Insufficient documentation

## 2016-04-21 MED ORDER — METHYLPREDNISOLONE ACETATE 40 MG/ML IJ SUSP
40.0000 mg | Freq: Once | INTRAMUSCULAR | Status: AC
  Administered 2016-04-21: 40 mg via INTRA_ARTICULAR

## 2016-04-21 NOTE — Progress Notes (Signed)
   CC: Right knee pain and swelling  HPI:  [name] is a 50 y.o. [place] lady with history of right knee osteoarthritis who presents with 3 days of right knee pain and swelling. She has chronic knee pain with relief after knee injections in the past, last given in May 2016. She takes Motrin or Aleve as needed with some relief as well. She denies any change in activity or injury to the area that may have exacerbated her pain. Her pain is intermittent, worse when walking and climbing stairs, and bothers her at night as well. She has not noticed any erythema at the knee or had fevers at home.   ** A plain film of her right knee in January 2016 showed advanced osteoarthritis most prominent in the medial and patellofemoral compartments.   No past medical history on file.  Review of Systems:   Review of Systems  Constitutional: Negative for fever.  Musculoskeletal: Positive for joint pain. Negative for myalgias and falls.  Neurological: Positive for tingling.     Physical Exam:  Filed Vitals:   04/21/16 1126  BP: 133/86  Pulse: 77  Height: 5\' 1"  (1.549 m)  Weight: 200 lb (90.719 kg)   Physical Exam  Constitutional: She appears well-developed and well-nourished.  Musculoskeletal:  Pain elicited at the right knee with extension. ROM otherwise intact bilaterally. Swelling noted at the right knee superolateral to the patella and inferiorly. There is slight warmth to touch compared to the left knee. No erythema, bruising, or laceration noted. Negative McMurray test. Hip abduction intact bilaterally.  Neurological:  Strength 5/5 bilateral lower extremities with flexion and extension at the knee. Dorsiflexion and plantarflexion intact bilaterally. Hip abduction strength 5/5 bilaterally.   U/S: Large right knee suprapatellar pouch effusion with arthritic changes - small spurs and pseudo cyst - along joint lines.  Cartilage narrow at midline medial.   Assessment & Plan:  Right  knee osteoarthritis with suprapatellar pouch effusion: -Right knee joint Depo-Medrol injection given today -ACE wrap with moderate compression of right knee, patient instructed on wrapping techniques at home for the next few days -Continue NSAID as needed  PROCEDURE NOTE  PROCEDURE: right knee joint steroid injection.  PREOPERATIVE DIAGNOSIS: Osteoarthritis of the right knee with effusion.  POSTOPERATIVE DIAGNOSIS: Osteoarthritis of the right knee.  PROCEDURE: Written consent obtained for the procedure. The patient's knee was then marked at the appropriate site for injection placement with U/S guidance. The knee was prepped with alcohol and freeze spray to anesthisize the skin. A 22 gauge needle was inserted in the suprapatellar aspect of the joint from a lateral approach with injection of Depomedrol.  COMPLICATIONS: None; patient tolerated procedure well.  The procedure was supervised by attending physician, Dr. Darrick PennaFields.

## 2016-04-21 NOTE — Assessment & Plan Note (Signed)
CSI today Cont prn ibuoprofen  Do easy exercises to strengthen quads and hip abductors  Use compression wrap with ace when swollen  Ice

## 2016-05-02 ENCOUNTER — Emergency Department (HOSPITAL_COMMUNITY)
Admission: EM | Admit: 2016-05-02 | Discharge: 2016-05-02 | Disposition: A | Discharge status: Home / Self Care | Payer: Self-pay | Attending: Emergency Medicine | Admitting: Emergency Medicine

## 2016-05-02 ENCOUNTER — Encounter (HOSPITAL_COMMUNITY): Payer: Self-pay | Admitting: Emergency Medicine

## 2016-05-02 ENCOUNTER — Emergency Department (HOSPITAL_COMMUNITY): Payer: Self-pay

## 2016-05-02 DIAGNOSIS — M25561 Pain in right knee: Secondary | ICD-10-CM

## 2016-05-02 DIAGNOSIS — R0789 Other chest pain: Secondary | ICD-10-CM | POA: Insufficient documentation

## 2016-05-02 LAB — CBC WITH DIFFERENTIAL/PLATELET
Basophils Absolute: 0 10*3/uL (ref 0.0–0.1)
Basophils Relative: 0 %
Eosinophils Absolute: 0.1 10*3/uL (ref 0.0–0.7)
Eosinophils Relative: 1 %
HEMATOCRIT: 35.6 % — AB (ref 36.0–46.0)
HEMOGLOBIN: 11.9 g/dL — AB (ref 12.0–15.0)
LYMPHS ABS: 2.4 10*3/uL (ref 0.7–4.0)
Lymphocytes Relative: 46 %
MCH: 29.6 pg (ref 26.0–34.0)
MCHC: 33.4 g/dL (ref 30.0–36.0)
MCV: 88.6 fL (ref 78.0–100.0)
MONOS PCT: 9 %
Monocytes Absolute: 0.4 10*3/uL (ref 0.1–1.0)
NEUTROS ABS: 2.2 10*3/uL (ref 1.7–7.7)
NEUTROS PCT: 44 %
Platelets: 283 10*3/uL (ref 150–400)
RBC: 4.02 MIL/uL (ref 3.87–5.11)
RDW: 12.6 % (ref 11.5–15.5)
WBC: 5.1 10*3/uL (ref 4.0–10.5)

## 2016-05-02 LAB — COMPREHENSIVE METABOLIC PANEL
ALK PHOS: 73 U/L (ref 38–126)
ALT: 11 U/L — ABNORMAL LOW (ref 14–54)
AST: 20 U/L (ref 15–41)
Albumin: 3.9 g/dL (ref 3.5–5.0)
Anion gap: 6 (ref 5–15)
BILIRUBIN TOTAL: 0.6 mg/dL (ref 0.3–1.2)
BUN: 11 mg/dL (ref 6–20)
CALCIUM: 9.4 mg/dL (ref 8.9–10.3)
CO2: 29 mmol/L (ref 22–32)
CREATININE: 0.71 mg/dL (ref 0.44–1.00)
Chloride: 102 mmol/L (ref 101–111)
GFR calc non Af Amer: >60 mL/min (ref >60)
Glucose, Bld: 110 mg/dL — ABNORMAL HIGH (ref 65–99)
Potassium: 4.1 mmol/L (ref 3.5–5.1)
Sodium: 137 mmol/L (ref 135–145)
Total Protein: 8.2 g/dL — ABNORMAL HIGH (ref 6.5–8.1)

## 2016-05-02 LAB — I-STAT TROPONIN, ED: Troponin i, poc: 0 ng/mL (ref 0.00–0.08)

## 2016-05-02 MED ORDER — CYCLOBENZAPRINE HCL 10 MG PO TABS
5.0000 mg | ORAL_TABLET | Freq: Once | ORAL | Status: AC
  Administered 2016-05-02: 5 mg via ORAL
  Filled 2016-05-02: qty 1

## 2016-05-02 MED ORDER — CYCLOBENZAPRINE HCL 10 MG PO TABS: 10.0000 mg | ORAL_TABLET | Freq: Three times a day (TID) | ORAL | 0 refills | Status: DC

## 2016-05-02 MED ORDER — DICLOFENAC SODIUM 75 MG PO TBEC: DELAYED_RELEASE_TABLET | ORAL | 1 refills | Status: DC

## 2016-05-02 NOTE — ED Notes (Signed)
Patient reports pain over the left scapula and central chest for the past few days.  Lifts heavy things at work.  Has not taken any medications for it.  No hx of asthma.  Can recreate the pain with palpation.

## 2016-05-02 NOTE — Discharge Instructions (Signed)
Take the Flexeril as directed.  3 times a day, as well as the Diflucan you have chest wall discomfort.  Please contact your primary care physician for further evaluation and perhaps physical therapy help strengthen the area

## 2016-05-02 NOTE — ED Provider Notes (Signed)
[facility]-EMER[facility]ENCY DEPT Provider Note   CSN: [id number] Arrival date & time: 05/02/16  1653  First Provider Contact:  First MD Initiated Contact with Patient 05/02/16 2203        History   Chief Complaint Chief Complaint  Patient presents with  . Chest Pain    HPI [name] is a 50 y.o. female.  This a 50 year old female who presents with 3, in half days.  Left-sided chest chest pain that is made worse with movement of her arm pain radiates to her left shoulder.  This is similar to the pain that she's had persistently for several years.  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  Denies any shortness of breath, diaphoresis, nausea.  She states when she takes ibuprofen it helps a little bit, but when medicine wears off, the pain comes back.      History reviewed. No pertinent past medical history.  Patient Active Problem List   Diagnosis Date Noted  . Osteoarthritis of right knee 04/21/2016  . Left shoulder pain 05/15/2014  . Low back pain 12/25/2013  . Right hip pain 11/29/2013  . Headache(784.0) 01/22/2013  . Hyperlipidemia LDL goal < 160 07/12/2012  . supraspinatus tendon partial tear 06/26/2012  . Language barrier 12/08/2011  . Ganglion cyst 12/08/2011  . Obesity (BMI 30.0-34.9) 12/08/2011    No past surgical history on file.  OB History    No data available       Home Medications    Prior to Admission medications   Medication Sig Start Date End Date Taking? Authorizing Provider  cyclobenzaprine (FLEXERIL) 10 MG tablet Take 1 tablet (10 mg total) by mouth 3 (three) times daily. 05/02/16   Earley Favor, NP  diclofenac (VOLTAREN) 75 MG EC tablet Take one tablet twice a day with food for 7 days, then take as needed 05/02/16   Earley Favor, NP  HYDROcodone-acetaminophen (NORCO/VICODIN) 5-325 MG tablet Take 1-2 tablets by mouth every 6 (six) hours as needed. 12/25/15   Roxy Horseman, PA-C  ibuprofen (ADVIL,MOTRIN) 600 MG tablet Take 1 tablet (600 mg  total) by mouth every 8 (eight) hours as needed. 02/02/15   Glori Luis, MD  oxyCODONE-acetaminophen (PERCOCET/ROXICET) 5-325 MG per tablet Take 1 tablet by mouth every 6 (six) hours as needed for severe pain. 05/16/14   Lenda Kelp, MD    Family History Family History  Problem Relation Age of Onset  . Sudden death Neg Hx   . Hypertension Neg Hx   . Hyperlipidemia Neg Hx   . Heart attack Neg Hx   . Diabetes Neg Hx     Social History Social History  Substance Use Topics  . Smoking status: Never Smoker  . Smokeless tobacco: Not on file  . Alcohol use No     Allergies   Review of patient's allergies indicates no known allergies.   Review of Systems Review of Systems  Respiratory: Negative for cough, chest tightness and shortness of breath.   Cardiovascular: Positive for chest pain. Negative for leg swelling.  Gastrointestinal: Negative for abdominal pain, nausea and vomiting.  Musculoskeletal: Positive for arthralgias.  Neurological: Negative for dizziness and headaches.  All other systems reviewed and are negative.    Physical Exam Updated Vital Signs BP 115/72   Pulse 70   Temp 98 F (36.7 C) (Oral)   Resp 16   Ht 5\' 2"  (1.575 m)   Wt 86.2 kg   SpO2 100%   BMI 34.75 kg/m  Physical Exam  Constitutional: She is oriented to person, place, and time. She appears well-developed and well-nourished.  HENT:  Head: Normocephalic.  Eyes: Pupils are equal, round, and reactive to light.  Neck: Normal range of motion.  Cardiovascular: Normal rate.   Pulmonary/Chest: Effort normal.  Abdominal: Soft. Bowel sounds are normal.  Musculoskeletal: Normal range of motion.  Neurological: She is alert and oriented to person, place, and time.  Skin: Skin is warm and dry. No rash noted.  Nursing note and vitals reviewed.    ED Treatments / Results  Labs (all labs ordered are listed, but only abnormal results are displayed) Labs Reviewed  CBC WITH  DIFFERENTIAL/PLATELET - Abnormal; Notable for the following:       Result Value   Hemoglobin 11.9 (*)    HCT 35.6 (*)    All other components within normal limits  COMPREHENSIVE METABOLIC PANEL - Abnormal; Notable for the following:    Glucose, Bld 110 (*)    Total Protein 8.2 (*)    ALT 11 (*)    All other components within normal limits  I-STAT TROPOININ, ED    EKG  EKG Interpretation None       Radiology Dg Chest 2 View  Result Date: 05/02/2016 CLINICAL DATA:  Left-sided chest pain radiating into the upper back for 3 days. EXAM: CHEST  2 VIEW COMPARISON:  PA and lateral chest 12/27/2010. FINDINGS: The lungs are clear. Heart size is upper normal. No pneumothorax pleural effusion. No focal bony abnormality. IMPRESSION: No acute disease. Electronically Signed   By: Drusilla Kanner M.D.   On: 05/02/2016 18:08    Procedures Procedures (including critical care time)  Medications Ordered in ED Medications  cyclobenzaprine (FLEXERIL) tablet 5 mg (not administered)     Initial Impression / Assessment and Plan / ED Course  I have reviewed the triage vital signs and the nursing notes.  Pertinent labs & imaging results that were available during my care of the patient were reviewed by me and considered in my medical decision making (see chart for details).  Clinical Course   Patient has chest wall pain is reproducible with palpation and movement of her left arm.  She again will be prescribed Flexeril, ibuprofen on a regular basis and follow-up with her primary care physician    Final Clinical Impressions(s) / ED Diagnoses   Final diagnoses:  Chest wall pain    New Prescriptions Current Discharge Medication List       Earley Favor, NP 05/02/16 2218    Earley Favor, NP 05/02/16 2220    Earley Favor, NP 05/02/16 2225    Tilden Fossa, MD 05/04/16 1351

## 2016-05-02 NOTE — ED Notes (Signed)
Pt d/c home. D/c instructions and scripts reviewed

## 2016-05-23 ENCOUNTER — Encounter: Payer: Self-pay | Admitting: Sports Medicine

## 2016-05-23 ENCOUNTER — Ambulatory Visit (INDEPENDENT_AMBULATORY_CARE_PROVIDER_SITE_OTHER): Payer: Self-pay | Admitting: Sports Medicine

## 2016-05-23 DIAGNOSIS — M25512 Pain in left shoulder: Secondary | ICD-10-CM

## 2016-05-23 DIAGNOSIS — M1711 Unilateral primary osteoarthritis, right knee: Secondary | ICD-10-CM

## 2016-05-23 MED ORDER — IBUPROFEN 600 MG PO TABS: 600.0000 mg | ORAL_TABLET | Freq: Three times a day (TID) | ORAL | 1 refills | Status: DC | PRN

## 2016-05-23 MED FILL — IBUPROFEN 600 MG TABLET: 600 | 30 days supply | Qty: 90 | Fill #0

## 2016-05-23 NOTE — Progress Notes (Signed)
   HPI  CC: Rt Knee Pain Patient is here for a follow-up on her right knee pain. She was provided a corticosteroid intra-articular knee injection on 04/21/16. She states that this injection only provided minimal relief and her pain is now back completely. Pain is located to the medial aspect of her right knee. Pain is worse with overuse and stairs. No significant difference with pain at night. She denies any clicking or catching of the affected knee. She endorses previous swelling of this knee but none presently. Denies any history of injury last trauma.  ROS: No numbness, paresthesias, weakness, or ecchymoses. Endorses some slight reduced range of motion  CC, SH/smoking status, and VS noted  Objective: BP (!) 123/91   Ht 5\' 1"  (1.549 m)   Wt 200 lb (90.7 kg)   BMI 37.79 kg/m  Gen: NAD, alert, cooperative. CV: Well-perfused. Resp: Non-labored. Neuro: Sensation intact throughout. Knee, Right: Normal to inspection with no erythema or effusion or obvious bony abnormalities. TTP along the medial joint line. +3 patellar crepitus with active flexion and extension. Relatively preserved ROM in flexion and extension. Ligaments with solid consistent endpoints including ACL, PCL, LCL, MCL. Non painful patellar compression. Patellar and quadriceps tendons unremarkable. Hamstring and quadriceps strength is normal.   Assessment and plan:  Osteoarthritis of right knee Patient is here with persistent right knee pain. Etiology likely osteoarthritis of the patellofemoral joint and medial compartment. There is likely some atraumatic meniscal tearing associated with this pain as well. Most recent x-rays done in 2016 showed relatively preserved joint space of the medial and lateral compartments but significant lateral joint compromise of the patella. - Encourage continue ice and compression of the affected joint. - NSAIDs - Patient is currently self-pay. We've discussed the importance of getting some kind of  insurance to help her pain and believe the next appropriate steps is for her to receive visco-supplementation. We have provided her information to initiate this process and have asked that she contact us once coverages initiated. At that time referral to orthopedics will be placed in hopes that she can initiate visco- supplementation injections   Meds ordered this encounter  Medications  . ibuprofen (ADVIL,MOTRIN) 600 MG tablet    Sig: Take 1 tablet (600 mg total) by mouth every 8 (eight) hours as needed.    Dispense:  90 tablet    Refill:  1     Kathee DeltonIan D McKeag, MD,MS,  PGY3 05/23/2016 9:10 AM  Patient seen and evaluated with the resident. I agree with the above plan of care. Patient would benefit from Visco supplementation. I will wait to hear back from her in regards to financial assistance or insurance before referring her to Dr.Xu. In the meantime, I've refilled her Advil to take as needed for pain.

## 2016-05-23 NOTE — Assessment & Plan Note (Signed)
Patient is here with persistent right knee pain. Etiology likely osteoarthritis of the patellofemoral joint and medial compartment. There is likely some atraumatic meniscal tearing associated with this pain as well. Most recent x-rays done in 2016 showed relatively preserved joint space of the medial and lateral compartments but significant lateral joint compromise of the patella. - Encourage continue ice and compression of the affected joint. - NSAIDs - Patient is currently self-pay. We've discussed the importance of getting some kind of insurance to help her pain and believe the next appropriate steps is for her to receive visco-supplementation. We have provided her information to initiate this process and have asked that she contact us once coverages initiated. At that time referral to orthopedics will be placed in hopes that she can initiate visco- supplementation injections

## 2016-05-27 ENCOUNTER — Ambulatory Visit (INDEPENDENT_AMBULATORY_CARE_PROVIDER_SITE_OTHER): Payer: Self-pay | Admitting: Family Medicine

## 2016-05-27 DIAGNOSIS — M25551 Pain in right hip: Secondary | ICD-10-CM

## 2016-05-27 MED ORDER — METHYLPREDNISOLONE ACETATE 40 MG/ML IJ SUSP
40.0000 mg | Freq: Once | INTRAMUSCULAR | Status: AC
  Administered 2016-05-27: 40 mg via INTRA_ARTICULAR

## 2016-05-27 NOTE — Progress Notes (Signed)
    CHIEF COMPLAINT / HPI: Patient declined offer of interpretor. Right hip pain, worse for the last 2 days. Had to miss work yesterday and today because of it. Has had this before, several years ago and he responded well to an injection. She's had no falls, not starting new activities or so she's not sure why she's having pain. Pain is localized in the lateral portion of the right hip, tender to palpation, bothers her at night when she tries to lie on that side. Has kept her awake for the last 2 nights. REVIEW OF SYSTEMS:  No fever, no unusual weight change, no leg weakness. No skin rash.  OBJECTIVE:  Vital signs are reviewed.   GEN.: Well-developed overweight female no acute distress HIPS: Internal/external rotation is full and painless on both sides. Flexor strength is normal. Area of the right greater trochanteric bursa is tender to palpation and this reproduces her pain. GAIT: Antalgic VASCULAR: Dorsalis pedis pulses 2+ bilaterally equal SKIN: Area of the right hip and right buttock is without any sign of rash or lesions.  INJECTION: Patient was given informed consent, signed copy in the chart. She could repeat to me that the risks were basically  "infection, redness, worse pain". She had no questions. She has had similar injection a couple of years ago and more recently a  knee injection.  Appropriate time out was taken. Area prepped and draped in usual sterile fashion. 1 cc of methylprednisolone 40 mg/ml plus  4 cc of 1% lidocaine without epinephrine was injected into the right greater trochanteric bursa using a(n) perpendicular approach. The patient tolerated the procedure well. There were no complications. Post procedure instructions were given.    ASSESSMENT / PLAN: Please see problem oriented charting for details

## 2016-05-27 NOTE — Assessment & Plan Note (Signed)
CSI for Greater trochanter  bursitis

## 2016-06-17 ENCOUNTER — Ambulatory Visit (INDEPENDENT_AMBULATORY_CARE_PROVIDER_SITE_OTHER): Payer: Self-pay | Admitting: Family Medicine

## 2016-06-17 ENCOUNTER — Ambulatory Visit (HOSPITAL_COMMUNITY)
Admission: RE | Admit: 2016-06-17 | Discharge: 2016-06-17 | Disposition: A | Discharge status: Home / Self Care | Payer: Self-pay | Source: Ambulatory Visit | Attending: Family Medicine | Admitting: Family Medicine

## 2016-06-17 ENCOUNTER — Encounter: Payer: Self-pay | Admitting: Family Medicine

## 2016-06-17 VITALS — BP 123/83 | HR 88 | Temp 98.2°F | Ht 61.0 in | Wt 209.0 lb

## 2016-06-17 DIAGNOSIS — Z114 Encounter for screening for human immunodeficiency virus [HIV]: Secondary | ICD-10-CM

## 2016-06-17 DIAGNOSIS — E669 Obesity, unspecified: Secondary | ICD-10-CM

## 2016-06-17 DIAGNOSIS — R079 Chest pain, unspecified: Secondary | ICD-10-CM | POA: Insufficient documentation

## 2016-06-17 DIAGNOSIS — R0789 Other chest pain: Secondary | ICD-10-CM | POA: Insufficient documentation

## 2016-06-17 DIAGNOSIS — R9431 Abnormal electrocardiogram [ECG] [EKG]: Secondary | ICD-10-CM | POA: Insufficient documentation

## 2016-06-17 DIAGNOSIS — R739 Hyperglycemia, unspecified: Secondary | ICD-10-CM

## 2016-06-17 LAB — CBC
HEMATOCRIT: 33.2 % — AB (ref 35.0–45.0)
HEMOGLOBIN: 11.3 g/dL — AB (ref 11.7–15.5)
MCH: 29.1 pg (ref 27.0–33.0)
MCHC: 34 g/dL (ref 32.0–36.0)
MCV: 85.6 fL (ref 80.0–100.0)
MPV: 8.9 fL (ref 7.5–12.5)
Platelets: 285 10*3/uL (ref 140–400)
RBC: 3.88 MIL/uL (ref 3.80–5.10)
RDW: 13.8 % (ref 11.0–15.0)
WBC: 4.8 10*3/uL (ref 3.8–10.8)

## 2016-06-17 LAB — BASIC METABOLIC PANEL WITH GFR
BUN: 11 mg/dL (ref 7–25)
CO2: 28 mmol/L (ref 20–31)
Calcium: 9 mg/dL (ref 8.6–10.4)
Chloride: 103 mmol/L (ref 98–110)
Creat: 0.75 mg/dL (ref 0.50–1.05)
GFR, Est African American: >89 mL/min (ref >=60)
GLUCOSE: 133 mg/dL — AB (ref 65–99)
POTASSIUM: 3.6 mmol/L (ref 3.5–5.3)
Sodium: 140 mmol/L (ref 135–146)

## 2016-06-17 LAB — LIPID PANEL
CHOL/HDL RATIO: 3.6 ratio (ref <=5.0)
Cholesterol: 196 mg/dL (ref 125–200)
HDL: 54 mg/dL (ref >=46)
LDL CALC: 106 mg/dL (ref <130)
Triglycerides: 181 mg/dL — ABNORMAL HIGH (ref <150)
VLDL: 36 mg/dL — ABNORMAL HIGH (ref <30)

## 2016-06-17 LAB — POCT GLYCOSYLATED HEMOGLOBIN (HGB A1C): Hemoglobin A1C: 5.9

## 2016-06-17 MED ORDER — RANITIDINE HCL 150 MG PO TABS: 150.0000 mg | ORAL_TABLET | Freq: Two times a day (BID) | ORAL | 3 refills | Status: DC

## 2016-06-17 MED ORDER — GI COCKTAIL ~~LOC~~
30.0000 mL | Freq: Once | ORAL | Status: AC
  Administered 2016-06-17: 30 mL via ORAL

## 2016-06-17 NOTE — Assessment & Plan Note (Signed)
Pain does not seem to be cardiac in nature, though patient with obesity. Does not have known T2DM or HLD, however labs have not been checked in several years. GI cocktail today did not improve pain. Pain not reproducible on exam. Will start ranitidine today to treat possible GERD component. Given possible risk factors and age, will refer to cardiology. She may need stress testing to rule out CAD. Will check A1c and lipid panel today.

## 2016-06-17 NOTE — Progress Notes (Signed)
    Subjective:  [name] is a 50 y.o. female who [facility] with a chief complaint of chest pain. Patient declined [place] interpretor.   HPI:  Chest Pain Patient with intermittent substernal chest pain that radiates into her neck for about the last 6 months. Pain does not occur everyday. May a few times per week. Lasts for about 10-15 minutes then goes away. Chest pain is associated with some shortness of breath. Pain is random in nature and is not precipitated or exacerbated by activity. Her pain seems to improve if she drinks water. She has not tried any other medications.   ROS: Per HPI  PMH: Smoking history reviewed.    Objective:  Physical Exam: BP 123/83   Pulse 88   Temp 98.2 F (36.8 C) (Oral)   Ht 5\' 1"  (1.549 m)   Wt 209 lb (94.8 kg)   BMI 39.49 kg/m   Gen: NAD, resting comfortably CV: RRR with no murmurs appreciated. Chest pain not reproducible. Pulm: NWOB, CTAB with no crackles, wheezes, or rhonchi GI: Normal bowel sounds present. Soft, Nontender, Nondistended. MSK: no edema, cyanosis, or clubbing noted Skin: warm, dry Neuro: grossly normal, moves all extremities Psych: Normal affect and thought content  EKG: Flat T waves in V1 and V2 (stable from prior), No acute ischemic changes.   Results for orders placed or performed in visit on 06/17/16 (from the past 72 hour(s))  POCT glycosylated hemoglobin (Hb A1C)     Status: None   Collection Time: 06/17/16  3:02 PM  Result Value Ref Range   Hemoglobin A1C 5.9     Assessment/Plan:  Chest pain Pain does not seem to be cardiac in nature, though patient with obesity. Does not have known T2DM or HLD, however labs have not been checked in several years. GI cocktail today did not improve pain. Pain not reproducible on exam. Will start ranitidine today to treat possible GERD component. Given possible risk factors and age, will refer to cardiology. She may need stress testing to rule out CAD. Will check  A1c and lipid panel today.    Katina Degreealeb M. Jimmey RalphParker, MD [facility] Resident PGY-3 06/17/2016 4:05 PM

## 2016-06-17 NOTE — Patient Instructions (Signed)
We will send you to the heart doctor to further evaluate your chest pain. You man need a stress test.  Please take the ranitidine twice daily  We will check blood work today.  Come back to see me in 3 months.  Take care,  Dr Jimmey RalphParker

## 2016-06-18 LAB — HIV ANTIBODY (ROUTINE TESTING W REFLEX): HIV: NONREACTIVE

## 2016-06-21 ENCOUNTER — Encounter: Payer: Self-pay | Admitting: Family Medicine

## 2016-07-01 ENCOUNTER — Encounter: Payer: Self-pay | Admitting: Cardiology

## 2016-07-02 NOTE — Progress Notes (Deleted)
Cardiology Office Note   Date:  07/02/2016   ID:  [name], DOB 1966-05-21, MRN [id number]  PCP:  Kelly Doe, MD  Cardiologist:   Kelly Rotunda, MD  Referring:  Dr. Jimmey Mcfarland  No chief complaint on file.     History of Present Illness: [name] is a 50 y.o. female who presents for evaluation of chest pain.  ***    No past medical history on file.  No past surgical history on file.   Current Outpatient Prescriptions  Medication Sig Dispense Refill  . cyclobenzaprine (FLEXERIL) 10 MG tablet Take 1 tablet (10 mg total) by mouth 3 (three) times daily. 20 tablet 0  . ibuprofen (ADVIL,MOTRIN) 600 MG tablet Take 1 tablet (600 mg total) by mouth every 8 (eight) hours as needed. 90 tablet 1  . ranitidine (ZANTAC) 150 MG tablet Take 1 tablet (150 mg total) by mouth 2 (two) times daily. 60 tablet 3   No current facility-administered medications for this visit.     Allergies:   Review of patient's allergies indicates no known allergies.    Social History:  The patient  reports that she has never smoked. She has never used smokeless tobacco. She reports that she does not drink alcohol or use drugs.   Family History:  The patient's ***family history is not on file.    ROS:  Please see the history of present illness.   Otherwise, review of systems are positive for {NONE DEFAULTED:18576::"none"}.   All other systems are reviewed and negative.    PHYSICAL EXAM: VS:  There were no vitals taken for this visit. , BMI There is no height or weight on file to calculate BMI. GENERAL:  Well appearing HEENT:  Pupils equal round and reactive, fundi not visualized, oral mucosa unremarkable NECK:  No jugular venous distention, waveform within normal limits, carotid upstroke brisk and symmetric, no bruits, no thyromegaly LYMPHATICS:  No cervical, inguinal adenopathy LUNGS:  Clear to auscultation bilaterally BACK:  No CVA tenderness CHEST:  Unremarkable HEART:  PMI not displaced or  sustained,S1 and S2 within normal limits, no S3, no S4, no clicks, no rubs, *** murmurs ABD:  Flat, positive bowel sounds normal in frequency in pitch, no bruits, no rebound, no guarding, no midline pulsatile mass, no hepatomegaly, no splenomegaly EXT:  2 plus pulses throughout, no edema, no cyanosis no clubbing SKIN:  No rashes no nodules NEURO:  Cranial nerves II through XII grossly intact, motor grossly intact throughout PSYCH:  Cognitively intact, oriented to person place and time    EKG:  EKG {ACTION; IS/IS NWG:95621308} ordered today. The ekg ordered today demonstrates ***   Recent Labs: 05/02/2016: ALT 11 06/17/2016: BUN 11; Creat 0.75; Hemoglobin 11.3; Platelets 285; Potassium 3.6; Sodium 140    Lipid Panel    Component Value Date/Time   CHOL 196 06/17/2016 1505   TRIG 181 (H) 06/17/2016 1505   HDL 54 06/17/2016 1505   CHOLHDL 3.6 06/17/2016 1505   VLDL 36 (H) 06/17/2016 1505   LDLCALC 106 06/17/2016 1505      Wt Readings from Last 3 Encounters:  06/17/16 209 lb (94.8 kg)  05/27/16 200 lb (90.7 kg)  05/23/16 200 lb (90.7 kg)      Other studies Reviewed: Additional studies/ records that were reviewed today include: ***. Review of the above records demonstrates:  Please see elsewhere in the note.  ***   ASSESSMENT AND PLAN:  ***   Current medicines are reviewed at length with the  patient today.  The patient {ACTIONS; HAS/DOES NOT HAVE:19233} concerns regarding medicines.  The following changes have been made:  {PLAN; NO CHANGE:13088:s}  Labs/ tests ordered today include: *** No orders of the defined types were placed in this encounter.    Disposition:   FU with ***    Signed, Kelly RotundaJames Adison Reifsteck, MD  07/02/2016 7:14 PM    [facility]

## 2016-07-04 ENCOUNTER — Encounter: Payer: Self-pay | Admitting: *Deleted

## 2016-07-04 ENCOUNTER — Ambulatory Visit: Payer: Self-pay | Admitting: Cardiology

## 2016-07-18 ENCOUNTER — Ambulatory Visit (INDEPENDENT_AMBULATORY_CARE_PROVIDER_SITE_OTHER): Payer: Self-pay | Admitting: Cardiology

## 2016-07-18 ENCOUNTER — Encounter: Payer: Self-pay | Admitting: Cardiology

## 2016-07-18 VITALS — BP 126/84 | Ht 61.0 in | Wt 215.0 lb

## 2016-07-18 DIAGNOSIS — R0789 Other chest pain: Secondary | ICD-10-CM

## 2016-07-18 NOTE — Patient Instructions (Signed)
Continue to take ranitidine for your discomfort.  Try and minimize taking Ibuprofen since this may upset your stomach.  Follow up with Dr. Jimmey RalphParker

## 2016-07-18 NOTE — Progress Notes (Signed)
Cardiology Office Note    Date:  07/18/2016   ID:  [name], DOB 02/28/1966, MRN [id number]  PCP:  Jacquiline Doealeb Parker, MD  Cardiologist:  Peter SwazilandJordan, MD    History of Present Illness:  Kelly Goodwa Mckeehan is a 50 y.o. female seen at the request of Dr. Jimmey RalphParker for evaluation of chest pain. She is seen with an interpreter. She is generally healthy. Over the last 6 months she has noted symptoms of warmth and discomfort beginning in the epigastric area and radiating into her chest, jaw and ears. Symptoms are worse when drinking black coffee and improved with drinking water. No dyspnea, palpitations, edema. States active and works at [employer]. Was started on ranitidine last month and since then has only noted one episode. No history of HTN, HL, diabetes. No family history of CAD. Non smoker. She does take Advil fairly often for "bad knees".     History reviewed. No pertinent past medical history.  History reviewed. No pertinent surgical history.  Current Medications: Outpatient Medications Prior to Visit  Medication Sig Dispense Refill  . ibuprofen (ADVIL,MOTRIN) 600 MG tablet Take 1 tablet (600 mg total) by mouth every 8 (eight) hours as needed. 90 tablet 1  . ranitidine (ZANTAC) 150 MG tablet Take 1 tablet (150 mg total) by mouth 2 (two) times daily. 60 tablet 3  . cyclobenzaprine (FLEXERIL) 10 MG tablet Take 1 tablet (10 mg total) by mouth 3 (three) times daily. 20 tablet 0   No facility-administered medications prior to visit.      Allergies:   Review of patient's allergies indicates no known allergies.   Social History   Social History  . Marital status: Married    Spouse name: N/A  . Number of children: 6  . Years of education: N/A   Occupational History  . [employer]    Social History Main Topics  . Smoking status: Never Smoker  . Smokeless tobacco: Never Used  . Alcohol use No  . Drug use: No  . Sexual activity: Yes   Other Topics Concern  . None    Social History Narrative   Originally from [place] Has been in [place]. [place] is second language. Unsure of first language. Speaks some/limited English.       Has 6 kids, 3 live with her. Lives with current boyfriend.      Family History:  The patient's family history is negative for CAD, CHF, arrhythmias.   ROS:   Please see the history of present illness.    ROS All other systems reviewed and are negative.   PHYSICAL EXAM:   VS:  BP 126/84   Ht 5\' 1"  (1.549 m)   Wt 215 lb (97.5 kg)   BMI 40.62 kg/m    GEN: Well nourished, well developed, in no acute distress  HEENT: normal  Neck: no JVD, carotid bruits, or masses Cardiac: RRR; no murmurs, rubs, or gallops,no edema  Respiratory:  clear to auscultation bilaterally, normal work of breathing GI: soft, nontender, nondistended, + BS MS: no deformity or atrophy  Skin: warm and dry, no rash Neuro:  Alert and Oriented x 3, Strength and sensation are intact Psych: euthymic mood, full affect  Wt Readings from Last 3 Encounters:  07/18/16 215 lb (97.5 kg)  06/17/16 209 lb (94.8 kg)  05/27/16 200 lb (90.7 kg)      Studies/Labs Reviewed:   EKG:  EKG dated 06/17/16 shows NSR rate 81. Nonspecific T wave  abnormality. I have personally reviewed and interpreted this study.  Recent Labs: 05/02/2016: ALT 11 06/17/2016: BUN 11; Creat 0.75; Hemoglobin 11.3; Platelets 285; Potassium 3.6; Sodium 140   Lipid Panel    Component Value Date/Time   CHOL 196 06/17/2016 1505   TRIG 181 (H) 06/17/2016 1505   HDL 54 06/17/2016 1505   CHOLHDL 3.6 06/17/2016 1505   VLDL 36 (H) 06/17/2016 1505   LDLCALC 106 06/17/2016 1505    Additional studies/ records that were reviewed today include:  CXR in July showed NAD   ASSESSMENT:    1. Atypical chest pain      PLAN:  In order of problems listed above:  1. Her chest pain is most consistent with GERD. She has symptomatically improved with ranitidine. CV risk is low. I do not  recommend any further cardiac testing. Continue ranitidine. Try and minimize NSAIDs. Follow up with Dr. Jimmey Ralph.     Medication Adjustments/Labs and Tests Ordered: Current medicines are reviewed at length with the patient today.  Concerns regarding medicines are outlined above.  Medication changes, Labs and Tests ordered today are listed in the Patient Instructions below. Patient Instructions  Continue to take ranitidine for your discomfort.  Try and minimize taking Ibuprofen since this may upset your stomach.  Follow up with Dr. Jimmey Ralph    Signed, Peter [place], MD  07/18/2016 2:25 PM    [facility] [address] [place], [place], [address] [phone]

## 2016-07-22 ENCOUNTER — Ambulatory Visit (INDEPENDENT_AMBULATORY_CARE_PROVIDER_SITE_OTHER): Payer: Self-pay | Admitting: Family Medicine

## 2016-07-22 ENCOUNTER — Encounter: Payer: Self-pay | Admitting: Family Medicine

## 2016-07-22 DIAGNOSIS — M25511 Pain in right shoulder: Secondary | ICD-10-CM

## 2016-07-22 DIAGNOSIS — M1711 Unilateral primary osteoarthritis, right knee: Secondary | ICD-10-CM

## 2016-07-22 NOTE — Progress Notes (Signed)
  Pauline Goodwa Cousar - 50 y.o. female MRN [id number]  Date of birth: 02/09/1966  SUBJECTIVE:  Including CC & ROS.  Chief Complaint  Patient presents with  . Shoulder Pain  . Knee Pain   An interpretor was used during this visit.   Ms. Ezra SitesDoumbia is a 50 yo F that is presenting with bilateral knee pain and shoulder pain. She has been seen for these knee pain previously.  She has a history of left shoulder pain. She received a greater trochanter injection on 05/27/16 and received a right knee injection on 04/21/16.   She reports her knee pain is posterior and anterior. This is acute on chronic pain. She has a history of OA of the knee. She denies any injury or prior surgery to her knee. She has taken ibuprofen. Pain is intermittent in nature.   She also reports shoulder pain that is acute on chronic. She has been seen for left shoulder pain but is now having right shoulder pain. She has pain on the anterior aspect of her shoulder. She has pain with abduction. It started gradual in nature. She denies any prior injury or surgery. There was no inciting event.   ROS: No unexpected weight loss, fever, chills, swelling, redness, otherwise see HPI    HISTORY: Past Medical, Surgical, Social, and Family History Reviewed & Updated per EMR.   Pertinent Historical Findings include: PMSHx -  HLD, obesity  PSHx -  No tobacco or alcohol  FHx -  None to contribute  Medications - ibuprofen   DATA REVIEWED: 10/08/2014 Knee Right: Age advanced osteoarthritis, without acute osseous finding. 2013 MRI left shoulder: Rotator cuff tendinopathy   PHYSICAL EXAM:  VS: BP:(!) 137/92  HR: bpm  TEMP: ( )  RESP:   HT:5\' 2"  (157.5 cm)   WT:209 lb (94.8 kg)  BMI:38.3 PHYSICAL EXAM: Gen: NAD, alert, cooperative with exam,  HEENT: clear conjunctiva, EOMI CV:  no edema, capillary refill brisk,  Resp: non-labored, normal speech Skin: no rashes, normal turgor  Neuro: no gross deficits.  Psych:  alert and oriented Right Knee:    Mild effusion.  TTP of the medial joint line.  Pain with extension  Pain with full flexion  Pain with patellar grind  Negative McMurray's test  neurovascularly intact  Right shoulder:  No obvious swelling  No erythema or ecchymosis  No TTP of the BT or AC joint  Abel to achieve full flexion and abduction passively and actively  Normal strength  Normal strength to resistance with IR and ER Negative Empty can  Pain with Hawkin's test   ASSESSMENT & PLAN:   Osteoarthritis of right knee She has known pathology within her knees and no new acute changes. She was encouraged weight loss today [facility]. Holding off on the steroid injection as she has a minimal improvement in the most recent injections.  - Advised her to follow up on her insurance situation. She would likely benefit from visco-supplementation   - home modalities sheet was provided for knee and hip abduction strength  - if she follows up could consider steroid injection   Right shoulder pain Likely to be related to impingement. Not likely to be adhesive capsulitis.  - provided Thera-band and home exercises  - consider US eval if there is no improvement if she follows up.

## 2016-07-22 NOTE — Progress Notes (Signed)
[facility]: Attending Note: I have reviewed the chart, discussed wit the Sports Medicine Fellow. I agree with assessment and treatment plan as detailed in the Fellow's note.  

## 2016-07-24 DIAGNOSIS — M25511 Pain in right shoulder: Secondary | ICD-10-CM | POA: Insufficient documentation

## 2016-07-24 NOTE — Assessment & Plan Note (Signed)
Likely to be related to impingement. Not likely to be adhesive capsulitis.  - provided Thera-band and home exercises  - consider US eval if there is no improvement if she follows up.

## 2016-07-24 NOTE — Assessment & Plan Note (Signed)
She has known pathology within her knees and no new acute changes. She was encouraged weight loss today [facility]. Holding off on the steroid injection as she has a minimal improvement in the most recent injections.  - Advised her to follow up on her insurance situation. She would likely benefit from visco-supplementation   - home modalities sheet was provided for knee and hip abduction strength  - if she follows up could consider steroid injection

## 2016-11-04 ENCOUNTER — Ambulatory Visit (INDEPENDENT_AMBULATORY_CARE_PROVIDER_SITE_OTHER): Payer: BLUE CROSS/BLUE SHIELD | Admitting: Family Medicine

## 2016-11-04 ENCOUNTER — Encounter: Payer: Self-pay | Admitting: Family Medicine

## 2016-11-04 DIAGNOSIS — M545 Low back pain: Secondary | ICD-10-CM | POA: Diagnosis not present

## 2016-11-04 DIAGNOSIS — M1711 Unilateral primary osteoarthritis, right knee: Secondary | ICD-10-CM | POA: Diagnosis not present

## 2016-11-04 DIAGNOSIS — G8929 Other chronic pain: Secondary | ICD-10-CM | POA: Diagnosis not present

## 2016-11-04 MED ORDER — METHYLPREDNISOLONE ACETATE 40 MG/ML IJ SUSP
40.0000 mg | Freq: Once | INTRAMUSCULAR | Status: AC
  Administered 2016-11-04: 40 mg via INTRA_ARTICULAR

## 2016-11-06 NOTE — Assessment & Plan Note (Signed)
Had acute exacerbation of her right knee pain. She feels like this is the same pain that she normally has. She denies any falls or injuries in the interim. - Right knee injection performed today. - Encouraged home modalities again. - Follow-up as needed.

## 2016-11-06 NOTE — Progress Notes (Signed)
[name] - 51 y.o. female MRN [id number]  Date of birth: 07/21/1966  SUBJECTIVE:  Including CC & ROS.   Ms. [name] is a 51 year old female is presenting with right knee pain and lower back pain. Right knee pain is acute on chronic in nature. She has a history of arthritis and has been unable to undergo physical supplementation at this time. She has had relief with steroid injections in the past with previous injection being sometime last fall. She denies any falls, buckling, or giving way. The pain is intermittent in nature. She denies a prior surgery to her knee. She takes ibuprofen for pain.  She is also reporting bilateral lower back pain. This pain has been ongoing for about 2 months now. She denies any radicular symptoms. She denies any inciting event. She denies any prior surgery on her back. She makes it may be related to her job. She has to bend over and pick things up on a regular basis. The pain seems to be worse with bending. Seems to be constant.  ROS: No unexpected weight loss, fever, chills, swelling, instability, numbness/tingling, redness, otherwise see HPI   HISTORY: Past Medical, Surgical, Social, and Family History Reviewed & Updated per EMR.   Pertinent Historical Findings include: PMSHx -  HLD, obesity   DATA REVIEWED: 10/08/2014 Knee Right: Age advanced osteoarthritis, without acute osseous finding. 2013 MRI left shoulder: Rotator cuff tendinopathy   PHYSICAL EXAM:  VS: BP:125/63  HR:78bpm  TEMP: ( )  RESP:   HT:5\' 1"  (154.9 cm)   WT:218 lb (98.9 kg)  BMI:41.3 PHYSICAL EXAM: Gen: NAD, alert, cooperative with exam, well-appearing HEENT: clear conjunctiva, EOMI CV:  no edema, capillary refill brisk,  Resp: non-labored, normal speech Skin: no rashes, normal turgor  Neuro: no gross deficits.  Psych:  alert and oriented Right knee:  no significant effusion. Tender to palpation of the medial joint line. No tenderness to palpation over the lateral joint line.    Normal in point with valgus and varus stress testing. Negative McMurray's test. No pain to palpation over the femoral condyles. Negative patellar grind and patellar compression.  Back: No tenderness to palpation of the lumbar spine. No tenderness to palpation of the SI joints. Some tenderness to palpation paraspinal muscles bilaterally. Normal internal and external rotation of the hips. Normal strength with hip flexion bilaterally. Normal straight leg raise bilaterally. Neurovascular intact    Aspiration/Injection Procedure Note Tamarah Quito 11/24/1965  Procedure: Injection Indications: Right knee pain with OA   Procedure Details Consent: Risks of procedure as well as the alternatives and risks of each were explained to the (patient/caregiver).  Consent for procedure obtained. Time Out: Verified patient identification, verified procedure, site/side was marked, verified correct patient position, special equipment/implants available, medications/allergies/relevent history reviewed, required imaging and test results available.  Performed.  The area was cleaned with iodine and alcohol swabs.    The right knee was injected using 1 cc's of 40 mg Depomedrol and 4 cc's of 1% lidocaine with a 22 1 1/2" needle.    A sterile dressing was applied.  Patient did tolerate procedure well.  ASSESSMENT & PLAN:   Low back pain Her pain seems to be related to muscle strain in nature. She denies a may be related to her job. Encourage proper lifting techniques. - Provided home exercises - Follow-up when necessary  Osteoarthritis of right knee Had acute exacerbation of her right knee pain. She feels like this is the same pain that she normally has. She  denies any falls or injuries in the interim. - Right knee injection performed today. - Encouraged home modalities again. - Follow-up as needed.

## 2016-11-06 NOTE — Assessment & Plan Note (Signed)
Her pain seems to be related to muscle strain in nature. She denies a may be related to her job. Encourage proper lifting techniques. - Provided home exercises - Follow-up when necessary

## 2016-11-30 ENCOUNTER — Other Ambulatory Visit: Payer: Self-pay | Admitting: Internal Medicine

## 2016-11-30 DIAGNOSIS — Z1231 Encounter for screening mammogram for malignant neoplasm of breast: Secondary | ICD-10-CM

## 2016-12-02 ENCOUNTER — Ambulatory Visit: Payer: BLUE CROSS/BLUE SHIELD | Admitting: Family Medicine

## 2016-12-06 ENCOUNTER — Other Ambulatory Visit: Payer: Self-pay | Admitting: Internal Medicine

## 2016-12-06 DIAGNOSIS — E2839 Other primary ovarian failure: Secondary | ICD-10-CM

## 2016-12-20 ENCOUNTER — Ambulatory Visit: Payer: BLUE CROSS/BLUE SHIELD

## 2016-12-27 ENCOUNTER — Ambulatory Visit
Admission: RE | Admit: 2016-12-27 | Discharge: 2016-12-27 | Disposition: A | Discharge status: Home / Self Care | Payer: BLUE CROSS/BLUE SHIELD | Source: Ambulatory Visit | Attending: Internal Medicine | Admitting: Internal Medicine

## 2016-12-27 DIAGNOSIS — Z1231 Encounter for screening mammogram for malignant neoplasm of breast: Secondary | ICD-10-CM

## 2016-12-27 DIAGNOSIS — E2839 Other primary ovarian failure: Secondary | ICD-10-CM

## 2017-01-13 ENCOUNTER — Encounter: Payer: Self-pay | Admitting: Gastroenterology

## 2017-02-16 ENCOUNTER — Ambulatory Visit (AMBULATORY_SURGERY_CENTER): Payer: Self-pay | Admitting: *Deleted

## 2017-02-16 VITALS — Ht 62.0 in | Wt 208.6 lb

## 2017-02-16 DIAGNOSIS — Z1211 Encounter for screening for malignant neoplasm of colon: Secondary | ICD-10-CM

## 2017-02-16 MED ORDER — NA SULFATE-K SULFATE-MG SULF 17.5-3.13-1.6 GM/177ML PO SOLN
1.0000 | Freq: Once | ORAL | 0 refills | Status: AC
Start: 2017-02-16 — End: 2017-02-16

## 2017-02-16 NOTE — Progress Notes (Signed)
No egg or soy allergy known to patient  No issues with past sedation with any surgeries  or procedures, no intubation problems  No diet pills per patient No home 02 use per patient  No blood thinners per patient  Pt denies issues with constipation  No A fib or A flutter  EMMI video sent to pt's e mail  Pay no more than 50 coupon to pt. For suprep  Video interpreter used for PV today- daughter in [place] as well and she speaks english- states she will help with instructions at home as well

## 2017-02-20 ENCOUNTER — Encounter: Payer: Self-pay | Admitting: Gastroenterology

## 2017-03-03 ENCOUNTER — Encounter: Payer: BLUE CROSS/BLUE SHIELD | Admitting: Gastroenterology

## 2017-03-21 ENCOUNTER — Ambulatory Visit (AMBULATORY_SURGERY_CENTER): Payer: BLUE CROSS/BLUE SHIELD | Admitting: Gastroenterology

## 2017-03-21 ENCOUNTER — Encounter: Payer: Self-pay | Admitting: Gastroenterology

## 2017-03-21 VITALS — BP 134/83 | HR 64 | Temp 98.4°F | Resp 13 | Ht 62.0 in | Wt 208.0 lb

## 2017-03-21 DIAGNOSIS — Z1211 Encounter for screening for malignant neoplasm of colon: Secondary | ICD-10-CM

## 2017-03-21 DIAGNOSIS — Z1212 Encounter for screening for malignant neoplasm of rectum: Secondary | ICD-10-CM

## 2017-03-21 MED ORDER — SODIUM CHLORIDE 0.9 % IV SOLN: 500.0000 mL | INTRAVENOUS | Status: DC

## 2017-03-21 NOTE — Patient Instructions (Signed)
YOU HAD AN ENDOSCOPIC PROCEDURE TODAY AT THE [facility]:   Refer to the procedure report that was given to you for any specific questions about what was found during the examination.  If the procedure report does not answer your questions, please call your gastroenterologist to clarify.  If you requested that your care partner not be given the details of your procedure findings, then the procedure report has been included in a sealed envelope for you to review at your convenience later.  YOU SHOULD EXPECT: Some feelings of bloating in the abdomen. Passage of more gas than usual.  Walking can help get rid of the air that was put into your GI tract during the procedure and reduce the bloating. If you had a lower endoscopy (such as a colonoscopy or flexible sigmoidoscopy) you may notice spotting of blood in your stool or on the toilet paper. If you underwent a bowel prep for your procedure, you may not have a normal bowel movement for a few days.  Please Note:  You might notice some irritation and congestion in your nose or some drainage.  This is from the oxygen used during your procedure.  There is no need for concern and it should clear up in a day or so.  SYMPTOMS TO REPORT IMMEDIATELY:   Following lower endoscopy (colonoscopy or flexible sigmoidoscopy):  Excessive amounts of blood in the stool  Significant tenderness or worsening of abdominal pains  Swelling of the abdomen that is new, acute  Fever of 100F or higher  For urgent or emergent issues, a gastroenterologist can be reached at any hour by calling [phone].   DIET:  We do recommend a small meal at first, but then you may proceed to your regular diet.  Drink plenty of fluids but you should avoid alcoholic beverages for 24 hours.  ACTIVITY:  You should plan to take it easy for the rest of today and you should NOT DRIVE or use heavy machinery until tomorrow (because of the sedation medicines used during the test).     FOLLOW UP: Our staff will call the number listed on your records the next business day following your procedure to check on you and address any questions or concerns that you may have regarding the information given to you following your procedure. If we do not reach you, we will leave a message.  However, if you are feeling well and you are not experiencing any problems, there is no need to return our call.  We will assume that you have returned to your regular daily activities without incident.  If any biopsies were taken you will be contacted by phone or by letter within the next 1-3 weeks.  Please call us at [phone] if you have not heard about the biopsies in 3 weeks.    SIGNATURES/CONFIDENTIALITY: You and/or your care partner have signed paperwork which will be entered into your electronic medical record.  These signatures attest to the fact that that the information above on your After Visit Summary has been reviewed and is understood.  Full responsibility of the confidentiality of this discharge information lies with you and/or your care-partner. 

## 2017-03-21 NOTE — Op Note (Addendum)
[facility] Patient Name: [name] Procedure Date: 03/21/2017 2:03 PM MRN: [id number] Endoscopist: Napoleon FormKavitha V. Nandigam , MD Age: 51 Referring MD:  Date of Birth: 11/05/1965 Gender: Female Account #: [id number] Procedure:                Colonoscopy Indications:              Screening for colorectal malignant neoplasm, This                            is the patient's first colonoscopy Medicines:                Monitored Anesthesia Care Procedure:                Pre-Anesthesia Assessment:                           - Prior to the procedure, a History and Physical                            was performed, and patient medications and                            allergies were reviewed. The patient's tolerance of                            previous anesthesia was also reviewed. The risks                            and benefits of the procedure and the sedation                            options and risks were discussed with the patient.                            All questions were answered, and informed consent                            was obtained. Prior Anticoagulants: The patient has                            taken no previous anticoagulant or antiplatelet                            agents. ASA Grade Assessment: II - A patient with                            mild systemic disease. After reviewing the risks                            and benefits, the patient was deemed in                            satisfactory condition to undergo the procedure.  After obtaining informed consent, the colonoscope                            was passed under direct vision. Throughout the                            procedure, the patient's blood pressure, pulse, and                            oxygen saturations were monitored continuously. The                            Colonoscope was introduced through the anus and                            advanced to the the  descending colon for                            evaluation. This was the intended extent. The                            colonoscopy was technically difficult and complex                            due to poor bowel prep with stool present. The                            patient tolerated the procedure well. The quality                            of the bowel preparation was poor. The rectum was                            photographed. Scope In: 2:08:23 PM Scope Out: 2:11:33 PM Total Procedure Duration: 0 hours 3 minutes 10 seconds  Findings:                 The perianal and digital rectal examinations were                            normal.                           A moderate amount of solid stool was found in the                            rectum, in the sigmoid colon and in the descending                            colon, making visualization difficult. Complications:            No immediate complications. Estimated Blood Loss:     Estimated blood loss: none. Impression:               - Preparation of the colon was poor.                           -  Stool in the rectum, in the sigmoid colon and in                            the descending colon.                           - No specimens collected. Recommendation:           - Patient has a contact number available for                            emergencies. The signs and symptoms of potential                            delayed complications were discussed with the                            patient. Return to normal activities tomorrow.                            Written discharge instructions were provided to the                            patient.                           - Resume previous diet.                           - Continue present medications.                           - Repeat colonoscopy at the next available                            appointment because the bowel preparation was poor.                           - For  future colonoscopy the patient will not                            require an extended preparation. If there are any                            questions, please contact the gastroenterologist.                            (Patient ate dinner after prep and did not do the                            split prep) Napoleon Form, MD 03/21/2017 2:15:47 PM This report has been signed electronically.

## 2017-03-21 NOTE — Progress Notes (Signed)
Report to PACU, RN, vss, BBS= Clear.  

## 2017-03-22 ENCOUNTER — Telehealth: Payer: Self-pay | Admitting: *Deleted

## 2017-03-22 NOTE — Telephone Encounter (Signed)
No answer, message left for the patient. 

## 2017-03-22 NOTE — Telephone Encounter (Signed)
  Follow up Call-  Call back number 03/21/2017  Post procedure Call Back phone  # [phone]  Permission to leave phone message Yes  Some recent data might be hidden     Patient questions:  Do you have a fever, pain , or abdominal swelling? No. Pain Score  0 *  Have you tolerated food without any problems? Yes.    Have you been able to return to your normal activities? Yes.    Do you have any questions about your discharge instructions: Diet   No. Medications  No. Follow up visit  No.  Do you have questions or concerns about your Care? No.  Actions: * If pain score is 4 or above: No action needed, pain <4.

## 2017-03-31 ENCOUNTER — Encounter: Payer: BLUE CROSS/BLUE SHIELD | Admitting: Gastroenterology

## 2017-07-05 ENCOUNTER — Ambulatory Visit (INDEPENDENT_AMBULATORY_CARE_PROVIDER_SITE_OTHER): Payer: BLUE CROSS/BLUE SHIELD | Admitting: Family Medicine

## 2017-07-05 VITALS — BP 120/78 | Ht 62.0 in | Wt 202.0 lb

## 2017-07-05 DIAGNOSIS — M1711 Unilateral primary osteoarthritis, right knee: Secondary | ICD-10-CM | POA: Diagnosis not present

## 2017-07-05 DIAGNOSIS — S46812A Strain of other muscles, fascia and tendons at shoulder and upper arm level, left arm, initial encounter: Secondary | ICD-10-CM

## 2017-07-05 DIAGNOSIS — M545 Low back pain, unspecified: Secondary | ICD-10-CM

## 2017-07-05 MED ORDER — METHYLPREDNISOLONE ACETATE 40 MG/ML IJ SUSP
40.0000 mg | Freq: Once | INTRAMUSCULAR | Status: AC
  Administered 2017-07-05: 40 mg via INTRA_ARTICULAR

## 2017-07-05 NOTE — Progress Notes (Addendum)
Subjective:    Patient ID: [name], female    DOB: [birth year], 51 y.o.   MRN: [id number]  HPI Lennyx Whisenant is 51 yo [place] speaking African women from the [place] presents today to discuss her right knee pain, left arm pain, and lower back pain. She states that she can not stand at work for a long period of time because of the dull achy pain in the entire knee. She has had an X-ray that showed tricompartmental arthritis and at the last visit received an intraarticular corticosteroid injection about 6 months ago, which provided some great relief.  In terms of her arm. She report pain over the left medial bicep region. She has difficulty at work lifting heavy objects because of this aching pain. She was diagnosed with a muscle strain at the last visit and given some exercises to perform at home, which she has not done regularly. She using warm compresses to relieve the pain at the end of the day. She also reports lower back pain, located in the sacroiliac region. She has pain with prolonged standing and after sitting for a long period of time. The pain does radiate to the anterior thighs at times. She has used warm compresses to relive this pain as well, which has helped some.    Review of Systems Musculoskeletal: no swelling or weakness, pain as described above Neurovascular: no numbness or pallor    Objective:   Physical Exam BP 120/78   Ht  (1.575 m)   Wt 202 lb (91.6 kg)   BMI 36.95 kg/m  Gen: with friend/intetpreter, obese, no acute distress pleasant,  Resp: no labored breathing Right knee: Inspection: bony prominence of knee, patellar are equal bilaterally Palpation: pain and crepitus felt over patella with knee extension, mild suprapatellar tenderness, lateral joint line, MCL, & and popliteal region; no pain over infrapatellar regions, tibial plateau, medial joint line or LCL ROM: 30 degree internal rotation of hip, 45 degree external rotation of hip, 0 degree knee  extension, 120 degree knee flexion Strength: intact thorughout Special testing: Pain with Valgus stress, McMurray's produced pain with both internal and external rotation of the heel; No pain with Varus stress, bounce test, Anterior drawer or Lachman's    Left arm:  Inspection: prominent fatty tissus without swelling or skin changes noted Palpation: Tenderness mid way down the arm at the lateral aspect, no A/C joint, anterior or posterior shoulder pain ROM: full ROM with adduction, abduction, external rotation, internal rotation Strength: intact throughout Neurovascular: intact sensation, +2 pulses throughout Impringement: Negative Hawking's, O'brien's, Modified cross arm, Neer, Speed's   Back: Inspection: No rashes or lesions over lower back Palpation: pain over bilateral sacroiliac joints ROM: pain with forward flexion/extension, mild pain with side bending Sensation: intact  Ultrasound left arm:  Biceps tendon: intact, no gross abnormalities noted Subscapularis: insert intact, move freely under cricoid, no edema noted Supraspinatus: intact, moves freely Infraspinatus: intact, no overt abnormalities noted A/C joint: mild edema, Mcfarland space  Ultrasound guided injection is preferred based studies that show increased duration, increased effect, greater accuracy, decreased procedural pain, increased response rate, and decreased cost with ultrasound guided versus blind injection.  Written informed consent obtained.  Time-out conducted.  Noted no overlying erythema, induration, or other signs of local infection.  Skin prepped in a sterile fashion.  Local anesthesia: Topical Ethyl chloride.  With sterile technique and under real time ultrasound guidance: needle advanced into suprapatellar bursa, bursa seen distending under real time ultrasound guidance.  80 mg Depo-Medrol, 4 cc lidocaine injected into suprapatellar bursa. Completed without difficulty  Pain improved after injection  suggesting accurate placement of the medication.  Advised to call if fevers/chills, erythema, induration, drainage, or persistent bleeding.  Images saved.    Assessment & Plan:  Right knee osteoarthritis Home strengthening and stretching exercises given today U/S-guided Intraarticular steroid injection injection performed today, see note above   Left distal deltoid strain Continue to use warm compresses for relief Avoid heavy lifting (> 10bs) especially with forward and lateral raise motion Home strengthening and stretching exercises given today  Bilateral sacroiliitis Likely secondary to her weight, posture, and overuse at work Continue to use warm compresses for relief  **Note above dictated by Marylene Buerger, MD, PGY3. I personally saw and independently evaluated patient today. In brief review, she is a 51 year old [place] only speaking African female from the [place] who presents to the sports medicine office today with multiple concerns today. He discuss having left lateral upper arm pain, right knee pain, and low back pain. She does have known history of right knee osteoarthritis, had steroid injection approximate 6 months ago with an percent relief of symptoms for one to 2 months. She does have known history of left her acute her cuff tendinopathy as seen on previous MRI dated back on 09/21/12. She also has had chronic low back pain, was evaluated for this in sports medicine office proximally 8 months ago. She was provided home exercises and was given proper lifting techniques. She reports that she has not been doing any type of home exercises. No acute exacerbation of back pain. No red flag signs or symptoms on history or physical examination today for her back. Ultrasound of her left shoulder and deltoid muscle was evaluated today. No evidence of any tendinopathy today, no hypoechogenicity seen. Her biceps tendon is unremarkable. Has mild AC arthropathy but mushroom sign negative. She  does have slight hypoechogenicity seen along the deltoid muscle area where she is having pain today, raising suspicion of deltoid muscle strain. Ultrasound was also done of her right knee today, she does have a moderate amount of suprapatellar effusion on the right side, quadriceps tendon, patellar tendon normal, medial and lateral joint line unremarkable, no appreciable tearing seen in both medial and lateral meniscus. Discussed option of cortisone injection into her right knee will continue with anti-inflammatory medications. She does wish to proceed with cortisone injection into her right knee today. This was done without any complications noted, was done under ultrasound guidance into the suprapatellar pouch. His cussed physical therapy exercises for her to do at home regarding her deltoid muscle strain as well as her lumbar back strain. She will follow-up here in the sports medicine office on as needed basis.Haynes Kerns, MD Primary Care Sports Medicine Fellow [facility]

## 2017-08-18 ENCOUNTER — Ambulatory Visit (INDEPENDENT_AMBULATORY_CARE_PROVIDER_SITE_OTHER): Payer: BLUE CROSS/BLUE SHIELD | Admitting: Family Medicine

## 2017-08-18 ENCOUNTER — Encounter: Payer: Self-pay | Admitting: Family Medicine

## 2017-08-18 DIAGNOSIS — M25512 Pain in left shoulder: Secondary | ICD-10-CM | POA: Diagnosis not present

## 2017-08-18 DIAGNOSIS — G8929 Other chronic pain: Secondary | ICD-10-CM | POA: Diagnosis not present

## 2017-08-18 MED ORDER — METHYLPREDNISOLONE ACETATE 80 MG/ML IJ SUSP
80.0000 mg | Freq: Once | INTRAMUSCULAR | Status: AC
  Administered 2017-08-18: 80 mg via INTRA_ARTICULAR

## 2017-08-18 NOTE — Assessment & Plan Note (Signed)
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 Patient had an MRI in 2013 that showed similar findings- rotator cuff tendinopathy without tear, tendinopathy of the long head of the biceps without tear, moderate AC degenerative change, subacromial/subdeltoid bursitis. No bony tenderness or injury to suggest fracture. Good strength in upper extremities and negative drop arm test, making rotator cuff tear unlikely. - Subacromial cortisone injection of left shoulder performed in clinic today (see procedure note below). - Patient can ice the shoulder tonight if it is sore - Follow-up as needed

## 2017-08-18 NOTE — Progress Notes (Addendum)
   [facility] Phone: [phone]  Subjective:  [name] is a 51 year old female presenting to clinic with chronic left shoulder pain for years. The pain is located in the front of her shoulder. The pain feels like a "biting pain" and feels like it is "inside her shoulder". She is unsure if the pain has been getting better or worse, but she does feel like the pain has persisted. The pain is worse with lifting overhead. Nothing makes the pain better. She denies any neck pain. She denies any pain that radiates down her arm to her fingers. No swelling, no erythema. No recent injuries. She had a cortisone injection in her left shoulder about two years ago. She remembers that this helped, but cannot remember how long it helped for.  ROS: See HPI for pertinent positives and negatives  Objective: BP 128/90   Ht 5\' 2"  (1.575 m)   Wt 202 lb (91.6 kg)   BMI 36.95 kg/m  Gen: NAD, alert, cooperative with exam Left shoulder: No erythema, no edema, no gross deformity. Normal ROM of the shoulder, but she does have pain with abduction and internal rotation. +mild tenderness to palpation over the biceps tendon. +Neer's test. +Hawkin's test. +Empty can test. Mildly positive cross-arm test. Negative drop arm test. Negative O'Brien's. Negative Speed's. Neuro: 5/5 strength in upper extremities bilaterally. Left arm is neurovascularly intact.   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 Patient had an MRI in 2013 that showed similar findings- rotator cuff tendinopathy without tear, tendinopathy of the long head of the biceps without tear, moderate AC degenerative change, subacromial/subdeltoid bursitis. No bony tenderness or injury to suggest fracture. Good strength in upper extremities and negative drop arm test, making rotator cuff tear unlikely. - Subacromial cortisone injection of left shoulder  performed in clinic today (see procedure note below). - Patient can ice the shoulder tonight if it is sore - Follow-up as needed  Procedure:  Injection of left shoulder Consent obtained and verified. Time-out conducted. Noted no overlying erythema, induration, or other signs of local infection. Patient not on anticoagulants  Skin prepped in a sterile fashion. Topical analgesic spray: Ethyl chloride. Injection completed without difficulty with Solumedrol 80 mg (1cc) and Lidocaine 1% (4cc)  Pain immediately improved suggesting accurate placement of the medication. Advised to call if fevers/chills, erythema, induration, drainage, or persistent bleeding.   Kelly CarolKaty Mayo, MD PGY-3  **I independently saw and examined patient with Kelly CarolKaty Mayo, MD, PGY3 and agree with her assessment and plan. 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 She did have MRI of her left shoulder done about 5 years ago back in Dec 2013, which demonstrated she had thickening of the supraspinatus, infraspinatus, and subscapularis tendons, also had subacromial bursitis. Given pain, gave her option of having cortisone injection. She is agreeable to this. Precepted Kelly. [name] and was present in room at time of injection. No complications noted. She will follow up on as needed basis.[name]**  Kelly Lake, MD Primary Care Sports Medicine Fellow [facility]

## 2017-11-04 ENCOUNTER — Encounter (HOSPITAL_COMMUNITY): Payer: Self-pay | Admitting: Emergency Medicine

## 2017-11-04 ENCOUNTER — Other Ambulatory Visit: Payer: Self-pay

## 2017-11-04 ENCOUNTER — Emergency Department (HOSPITAL_COMMUNITY): Payer: BLUE CROSS/BLUE SHIELD

## 2017-11-04 ENCOUNTER — Emergency Department (HOSPITAL_COMMUNITY)
Admission: EM | Admit: 2017-11-04 | Discharge: 2017-11-04 | Disposition: A | Discharge status: Home / Self Care | Payer: BLUE CROSS/BLUE SHIELD | Attending: Emergency Medicine | Admitting: Emergency Medicine

## 2017-11-04 DIAGNOSIS — M25532 Pain in left wrist: Secondary | ICD-10-CM | POA: Diagnosis present

## 2017-11-04 MED ORDER — IBUPROFEN 800 MG PO TABS
800.0000 mg | ORAL_TABLET | Freq: Once | ORAL | Status: AC
  Administered 2017-11-04: 800 mg via ORAL
  Filled 2017-11-04: qty 1

## 2017-11-04 MED ORDER — INDOMETHACIN 50 MG PO CAPS: 50.0000 mg | ORAL_CAPSULE | Freq: Three times a day (TID) | ORAL | 0 refills | Status: DC

## 2017-11-04 MED ORDER — HYDROCODONE-ACETAMINOPHEN 5-325 MG PO TABS
1.0000 | ORAL_TABLET | Freq: Once | ORAL | Status: AC
  Administered 2017-11-04: 1 via ORAL
  Filled 2017-11-04: qty 1

## 2017-11-04 MED ORDER — PREDNISONE 20 MG PO TABS: 40.0000 mg | ORAL_TABLET | Freq: Every day | ORAL | 0 refills | Status: DC

## 2017-11-04 NOTE — ED Triage Notes (Signed)
Pt has swelling on left wrist area-- states has happened 2x in past-- no injury, unsure of name of dx from past.

## 2017-11-04 NOTE — Discharge Instructions (Signed)
It was my pleasure taking care of you today!   Take Prednisone daily starting today. Take indomethacin three times daily as needed.   Call your sports medicine doctor first thing Monday morning to schedule a follow up appointment.  Return to ER for fevers, new or worsening symptoms, any additional concerns.

## 2017-11-04 NOTE — ED Notes (Signed)
Declined W/C at D/C and was escorted to lobby by RN. 

## 2017-11-04 NOTE — ED Provider Notes (Signed)
[facility] Provider Note   CSN: [id number] Arrival date & time: 11/04/17  1403     History   Chief Complaint Chief Complaint  Patient presents with  . Wrist Pain    HPI [name] is a 52 y.o. female.  The history is provided by the patient and medical records. No language interpreter was used.  Wrist Pain    [name] is a 52 y.o. female  with a PMH as listed below who presents to the Emergency Department complaining of left wrist pain.  States that she started feeling discomfort to the left wrist about 2-3 days ago, however last night, pain acutely worsened and swelling started to develop. Pain worse with any movement of the wrist. No medications taken prior to arrival for symptoms. She states this has happened twice in the past, most recently about 3 years ago, but she does not remember what they told her caused the symptoms. Denies fever, chills, redness. No injury or inciting event.   Past Medical History:  Diagnosis Date  . GERD (gastroesophageal reflux disease)   . Knee pain   . Lower back pain     Patient Active Problem List   Diagnosis Date Noted  . Right shoulder pain 07/24/2016  . Atypical chest pain 06/17/2016  . Osteoarthritis of right knee 04/21/2016  . Left shoulder pain 05/15/2014  . Low back pain 12/25/2013  . Right hip pain 11/29/2013  . Headache(784.0) 01/22/2013  . Hyperlipidemia LDL goal < 160 07/12/2012  . supraspinatus tendon partial tear 06/26/2012  . Language barrier 12/08/2011  . Ganglion cyst 12/08/2011  . Obesity (BMI 30.0-34.9) 12/08/2011    Past Surgical History:  Procedure Laterality Date  . knee injections    . shoulder injections      OB History    No data available       Home Medications    Prior to Admission medications   Medication Sig Start Date End Date Taking? Authorizing Provider  diclofenac (VOLTAREN) 75 MG EC tablet  11/21/16   [provider]  ibuprofen  (ADVIL,MOTRIN) 600 MG tablet Take 1 tablet (600 mg total) by mouth every 8 (eight) hours as needed. 05/23/16   Ralene Corkraper, Timothy R, DO  indomethacin (INDOCIN) 50 MG capsule Take 1 capsule (50 mg total) by mouth 3 (three) times daily with meals. 11/04/17   Oluwaseun Bruyere, Chase PicketJaime Pilcher, PA-C  Multiple Vitamin (MULTIVITAMIN) tablet Take 1 tablet by mouth daily. Doesn't take daily    [provider]  predniSONE (DELTASONE) 20 MG tablet Take 2 tablets (40 mg total) by mouth daily. 11/04/17   Oronde Hallenbeck, Chase PicketJaime Pilcher, PA-C  ranitidine (ZANTAC) 150 MG tablet Take 1 tablet (150 mg total) by mouth 2 (two) times daily. 06/17/16   Ardith DarkParker, Caleb M, MD  tiZANidine (ZANAFLEX) 4 MG tablet  11/21/16   [provider]    Family History Family History  Problem Relation Age of Onset  . Sudden death Neg Hx   . Hyperlipidemia Neg Hx   . Heart attack Neg Hx   . Diabetes Neg Hx   . Hypertension Neg Hx   . Heart disease Neg Hx   . Colon cancer Neg Hx   . Colon polyps Neg Hx   . Esophageal cancer Neg Hx   . Rectal cancer Neg Hx   . Stomach cancer Neg Hx     Social History Social History   Tobacco Use  . Smoking status: Never Smoker  . Smokeless tobacco:  Never Used  Substance Use Topics  . Alcohol use: No  . Drug use: No     Allergies   Patient has no known allergies.   Review of Systems Review of Systems  Musculoskeletal: Positive for arthralgias and joint swelling.  Skin: Negative for color change and wound.  Allergic/Immunologic: Negative for immunocompromised state.  Neurological: Negative for weakness and numbness.     Physical Exam Updated Vital Signs BP 127/90   Pulse 88   Temp 98.5 F (36.9 C) (Oral)   Resp 18   SpO2 99%   Physical Exam  Constitutional: She appears well-developed and well-nourished. No distress.  HENT:  Head: Normocephalic and atraumatic.  Neck: Neck supple.  Cardiovascular: Normal rate, regular rhythm and normal heart sounds.  No murmur  heard. Pulmonary/Chest: Effort normal and breath sounds normal. No respiratory distress. She has no wheezes. She has no rales.  Musculoskeletal:  Diffuse tenderness to the left wrist with associated swelling. Decreased ROM 2/2 pain. No erythema or warmth. No open wounds.   Neurological: She is alert.  Skin: Skin is warm and dry.  Nursing note and vitals reviewed.    ED Treatments / Results  Labs (all labs ordered are listed, but only abnormal results are displayed) Labs Reviewed - No data to display  EKG  EKG Interpretation None       Radiology Dg Wrist Complete Left  Result Date: 11/04/2017 CLINICAL DATA:  No known injury Severe left wrist pain X 3 days, more severe yesterday. EXAM: LEFT WRIST - COMPLETE 3+ VIEW COMPARISON:  None. FINDINGS: There is no evidence of fracture or dislocation. There is no evidence of arthropathy or other focal bone abnormality. Soft tissues are unremarkable. IMPRESSION: Negative. Electronically Signed   By: Norva Pavlov M.D.   On: 11/04/2017 14:48    Procedures Procedures (including critical care time)  Medications Ordered in ED Medications  ibuprofen (ADVIL,MOTRIN) tablet 800 mg (800 mg Oral Given 11/04/17 1448)  HYDROcodone-acetaminophen (NORCO/VICODIN) 5-325 MG per tablet 1 tablet (1 tablet Oral Given 11/04/17 1448)     Initial Impression / Assessment and Plan / ED Course  I have reviewed the triage vital signs and the nursing notes.  Pertinent labs & imaging results that were available during my care of the patient were reviewed by me and considered in my medical decision making (see chart for details).    [name] is a 52 y.o. female who presents to ED for atraumatic left wrist pain. Initially started 2-3 days ago with acute worsening last night. Hx of similar twice in the past, but unsure of diagnosis. On exam, patient with diffuse tenderness to the left wrist with associated left wrist swelling. NVI. No erythema or warmth. Doubt  septic joint. X-ray negative. Possible gout flare. She is followed by sports medicine for shoulder and knee problems. Will treat with NSAID's and steroid burst. She will call her sports medicine clinic Monday morning to arrange follow up for recheck. Reasons to return to ER discussed. All questions answered.   Final Clinical Impressions(s) / ED Diagnoses   Final diagnoses:  Acute pain of left wrist    ED Discharge Orders        Ordered    predniSONE (DELTASONE) 20 MG tablet  Daily     11/04/17 1503    indomethacin (INDOCIN) 50 MG capsule  3 times daily with meals     11/04/17 1503       Christie Viscomi, Chase Picket, PA-C 11/04/17 [phone]  Eber Hong, MD 11/05/17 [phone]

## 2017-11-28 ENCOUNTER — Emergency Department (HOSPITAL_COMMUNITY): Payer: BLUE CROSS/BLUE SHIELD

## 2017-11-28 ENCOUNTER — Encounter (HOSPITAL_COMMUNITY): Payer: Self-pay | Admitting: Emergency Medicine

## 2017-11-28 ENCOUNTER — Emergency Department (HOSPITAL_COMMUNITY)
Admission: EM | Admit: 2017-11-28 | Discharge: 2017-11-29 | Disposition: A | Discharge status: Home / Self Care | Payer: BLUE CROSS/BLUE SHIELD | Attending: Emergency Medicine | Admitting: Emergency Medicine

## 2017-11-28 ENCOUNTER — Other Ambulatory Visit: Payer: Self-pay

## 2017-11-28 DIAGNOSIS — R51 Headache: Secondary | ICD-10-CM | POA: Insufficient documentation

## 2017-11-28 DIAGNOSIS — J069 Acute upper respiratory infection, unspecified: Secondary | ICD-10-CM | POA: Insufficient documentation

## 2017-11-28 DIAGNOSIS — N644 Mastodynia: Secondary | ICD-10-CM | POA: Diagnosis present

## 2017-11-28 DIAGNOSIS — Z79899 Other long term (current) drug therapy: Secondary | ICD-10-CM | POA: Diagnosis not present

## 2017-11-28 NOTE — ED Provider Notes (Signed)
[facility] EMERGENCY DEPARTMENT Provider Note   CSN: [id number] Arrival date & time: 11/28/17  2121     History   Chief Complaint Chief Complaint  Patient presents with  . Breast Pain  . Nasal Congestion    HPI [name] is a 52 y.o. female with PMH/o GERD, arthritis, who presents for evaluation of 2 complaints. First, patient reports intermittent frontal headache that has been ongoing for the last 2 days. Patient states that she was at work when the headache began and that it gradually worsened. She describes it as "throbbing" headache and states it is a 4/10. She reports some slight associated photophobia. No nausea/vomiting. She reports that she took 1 dose of ibuprofen with helped improve the headache but that then it would return. She states that she has a history of headaches and that this feels similar to her previous headaches. She states that she had been seen for evaluation of headaches and possible migraines. She had been on Immitrex and and IM medication that she does not recall the name of but states that they did not help much. Patient states that she learned to control her headaches with the use of ibuprofen and has not had issues with them recently. She denies any preceding trauma, injury or fall. Additionally, she states that she has been having some associated cough, nasal congestion. She states that cough is non-productive. She has some associated chest soreness with coughing but denies any chest pain or SOB. Patient denies any fevers, vision changes, numbness/weakness of her arms or legs, chest pain, difficulty breathing, nausea/vomiting, back pain, neck pain.   Additionally, patient reports that she has been having intermittent right breast pain for 1 month. She states that the pain is noted to the lower part of her right breast. Pain is intermittent but does not have any specific thing that brings on the pain. She has not noticed a lump in her breasts  and denies any nipple discharge, warmth or erythema. She has not been evaluated for the pain.     The history is provided by the patient. The history is limited by a language barrier. A language interpreter was used.    Past Medical History:  Diagnosis Date  . GERD (gastroesophageal reflux disease)   . Knee pain   . Lower back pain     Patient Active Problem List   Diagnosis Date Noted  . Right shoulder pain 07/24/2016  . Atypical chest pain 06/17/2016  . Osteoarthritis of right knee 04/21/2016  . Left shoulder pain 05/15/2014  . Low back pain 12/25/2013  . Right hip pain 11/29/2013  . Headache(784.0) 01/22/2013  . Hyperlipidemia LDL goal < 160 07/12/2012  . supraspinatus tendon partial tear 06/26/2012  . Language barrier 12/08/2011  . Ganglion cyst 12/08/2011  . Obesity (BMI 30.0-34.9) 12/08/2011    Past Surgical History:  Procedure Laterality Date  . knee injections    . shoulder injections      OB History    No data available       Home Medications    Prior to Admission medications   Medication Sig Start Date End Date Taking? Authorizing Provider  benzonatate (TESSALON) 100 MG capsule Take 1 capsule (100 mg total) by mouth every 8 (eight) hours. 11/29/17   Maxwell Caul, PA-C  diclofenac (VOLTAREN) 75 MG EC tablet  11/21/16   [provider]  fluticasone (FLONASE) 50 MCG/ACT nasal spray Place 1 spray into both nostrils daily. 11/29/17  Maxwell CaulLayden, Sachin Ferencz A, PA-C  ibuprofen (ADVIL,MOTRIN) 600 MG tablet Take 1 tablet (600 mg total) by mouth every 8 (eight) hours as needed. 05/23/16   Ralene Corkraper, Timothy R, DO  indomethacin (INDOCIN) 50 MG capsule Take 1 capsule (50 mg total) by mouth 3 (three) times daily with meals. 11/04/17   Ward, Chase PicketJaime Pilcher, PA-C  Multiple Vitamin (MULTIVITAMIN) tablet Take 1 tablet by mouth daily. Doesn't take daily    [provider]  predniSONE (DELTASONE) 20 MG tablet Take 2 tablets (40 mg total) by mouth daily. 11/04/17   Ward,  Chase PicketJaime Pilcher, PA-C  ranitidine (ZANTAC) 150 MG tablet Take 1 tablet (150 mg total) by mouth 2 (two) times daily. 06/17/16   Ardith DarkParker, Caleb M, MD  tiZANidine (ZANAFLEX) 4 MG tablet  11/21/16   [provider]    Family History Family History  Problem Relation Age of Onset  . Sudden death Neg Hx   . Hyperlipidemia Neg Hx   . Heart attack Neg Hx   . Diabetes Neg Hx   . Hypertension Neg Hx   . Heart disease Neg Hx   . Colon cancer Neg Hx   . Colon polyps Neg Hx   . Esophageal cancer Neg Hx   . Rectal cancer Neg Hx   . Stomach cancer Neg Hx     Social History Social History   Tobacco Use  . Smoking status: Never Smoker  . Smokeless tobacco: Never Used  Substance Use Topics  . Alcohol use: No  . Drug use: No     Allergies   Patient has no known allergies.   Review of Systems Review of Systems  Constitutional: Negative for chills and fever.  HENT: Positive for congestion.   Eyes: Negative for visual disturbance.  Respiratory: Positive for cough. Negative for shortness of breath.   Cardiovascular: Negative for chest pain.  Gastrointestinal: Negative for abdominal pain, diarrhea, nausea and vomiting.  Musculoskeletal: Negative for back pain and neck pain.       Right breast pain  Neurological: Positive for headaches. Negative for dizziness, weakness and numbness.     Physical Exam Updated Vital Signs BP (!) 134/92 (BP Location: Right Arm)   Pulse 91   Temp 99.7 F (37.6 C) (Oral)   Resp 18   Ht 5\' 5"  (1.651 m)   Wt 90.7 kg (200 lb)   SpO2 100%   BMI 33.28 kg/m   Physical Exam  Constitutional: She is oriented to person, place, and time. She appears well-developed and well-nourished.  HENT:  Head: Normocephalic and atraumatic.  Nose: Mucosal edema present. Right sinus exhibits frontal sinus tenderness. Right sinus exhibits no maxillary sinus tenderness. Left sinus exhibits frontal sinus tenderness. Left sinus exhibits no maxillary sinus tenderness.    Mouth/Throat: Uvula is midline, oropharynx is clear and moist and mucous membranes are normal.  Eyes: Conjunctivae, EOM and lids are normal. Pupils are equal, round, and reactive to light.  Neck: Full passive range of motion without pain. Neck supple.  Full flexion/extension and lateral movement of neck fully intact. No bony midline tenderness. No deformities or crepitus. Neck is supple, no rigidity noted.   Cardiovascular: Normal rate, regular rhythm, normal heart sounds and normal pulses. Exam reveals no gallop and no friction rub.  No murmur heard. Pulmonary/Chest: Effort normal and breath sounds normal.  No evidence of respiratory distress. Able to speak in full sentences without difficulty. The exam was performed with a chaperone present. Mild tenderness noted to the lower medial aspect  of the right breast with no evidence of overlying warmth, erythema, induration. No palpable mass. No abnormalities of the areola or nipple. No discharge noted. No abnormalities of the left breast.   Abdominal: Soft. Normal appearance. There is no tenderness. There is no rigidity and no guarding.  Musculoskeletal: Normal range of motion.  Neurological: She is alert and oriented to person, place, and time.  Cranial nerves III-XII intact Follows commands, Moves all extremities  5/5 strength to BUE and BLE  Sensation intact throughout all major nerve distributions Normal finger to nose. No dysdiadochokinesia. No pronator drift. No gait abnormalities  No slurred speech. No facial droop.   Skin: Skin is warm and dry. Capillary refill takes less than 2 seconds.  Psychiatric: She has a normal mood and affect. Her speech is normal.  Nursing note and vitals reviewed.    ED Treatments / Results  Labs (all labs ordered are listed, but only abnormal results are displayed) Labs Reviewed - No data to display  EKG  EKG Interpretation None       Radiology Dg Chest 2 View  Result Date:  11/28/2017 CLINICAL DATA:  Nasal congestion EXAM: CHEST  2 VIEW COMPARISON:  05/02/2016 FINDINGS: Mild cardiomegaly.  No consolidation or effusion.  No pneumothorax. IMPRESSION: No active cardiopulmonary disease.  Mild cardiomegaly. Electronically Signed   By: Jasmine Pang M.D.   On: 11/28/2017 22:38    Procedures Procedures (including critical care time)  Medications Ordered in ED Medications  acetaminophen (TYLENOL) tablet 1,000 mg (1,000 mg Oral Given 11/29/17 0157)     Initial Impression / Assessment and Plan / ED Course  I have reviewed the triage vital signs and the nursing notes.  Pertinent labs & imaging results that were available during my care of the patient were reviewed by me and considered in my medical decision making (see chart for details).     53 y.o. F who presents for evaluation of 2 complaints tonight. First is intermittent headache, cough, nasal congestion ongoing 2 days. No vision changes, numbness/weakness. No preceding trauma, injury or fall. Patient is afebrile, non-toxic appearing, sitting comfortably on examination table. Vital signs reviewed. Patient is slightly hypertensive. Likely secondary to pain. No neuro deficits noted on exam. Consider migraine headache vs URI vs sinusitis. Patient does have cough/congestion and does have some sinus tenderness. Consider sinus pressure headache. History/physical exam is not concerning for ICH, encephalitis, cavernous sinus thrombosis, meningitis. Do not suspect hypertensive emergency.  Review of patient's records show that she does have a history of headaches that present very similarly. She had been evaluated by [facility] at that time and had been concerned regarding migraines and had been started on Immitrex. I discussed with patient and she stats that she did not like the Immitrex. She states that she will intermittently have headaches but they are controlled by rest and ibuprofen. Patient reports that today the cough was  bothering her more than the headache. Offerred patient IV analgesics but she declined at this time. Analgesics provided in the department. Regarding breast pain. No evidence of abscess, palpable mass. Consider musculoskeletal pain vs ligament pain. Do not suspect ACS or PE etiology as the pain is in the breast tissue itself and not the chest. Will plan for outpatient referral to [facility] for further evaluation. Chest XR ordered at triage.   CXR reviewed. Negative for any acute abnormality. Discussed results with patient. She reports improvement in headache after tylenol. States headache has improved to 1/10. She is  tolerating PO in the department without any difficulty. She is ambulating in the department without any difficulty. Patient stable for discharge. Will plan to discharge home with supportive care. Patient instructed to follow-up with breast center. Patient had ample opportunity for questions and discussion. All patient's questions were answered with full understanding. Strict return precautions discussed. Patient expresses understanding and agreement to plan.    Final Clinical Impressions(s) / ED Diagnoses   Final diagnoses:  Breast pain  Upper respiratory tract infection, unspecified type    ED Discharge Orders        Ordered    fluticasone (FLONASE) 50 MCG/ACT nasal spray  Daily     11/29/17 0144    benzonatate (TESSALON) 100 MG capsule  Every 8 hours     11/29/17 0145       Maxwell Caul, PA-C 11/29/17 1610    Glynn Octave, MD 11/29/17 [phone]

## 2017-11-28 NOTE — ED Triage Notes (Signed)
Pt reports R breast pain X several months, intermittent. Pt also reports cough, nasal congestion, HA onset yesterday.

## 2017-11-29 ENCOUNTER — Other Ambulatory Visit: Payer: Self-pay | Admitting: Internal Medicine

## 2017-11-29 DIAGNOSIS — Z1231 Encounter for screening mammogram for malignant neoplasm of breast: Secondary | ICD-10-CM

## 2017-11-29 MED ORDER — FLUTICASONE PROPIONATE 50 MCG/ACT NA SUSP: 1.0000 | Freq: Every day | NASAL | 2 refills | Status: DC

## 2017-11-29 MED ORDER — ACETAMINOPHEN 500 MG PO TABS
1000.0000 mg | ORAL_TABLET | Freq: Once | ORAL | Status: AC
  Administered 2017-11-29: 1000 mg via ORAL
  Filled 2017-11-29: qty 2

## 2017-11-29 MED ORDER — BENZONATATE 100 MG PO CAPS: 100.0000 mg | ORAL_CAPSULE | Freq: Three times a day (TID) | ORAL | 0 refills | Status: DC

## 2017-11-29 NOTE — Discharge Instructions (Addendum)
You can take Tylenol or Ibuprofen as directed for pain. You can alternate Tylenol and Ibuprofen every 4 hours. If you take Tylenol at 1pm, then you can take Ibuprofen at 5pm. Then you can take Tylenol again at 9pm.   Take Flonase as directed to help with nasal congestion   Take Tessalon Perles for cough.   As we discussed, [facility]. Call their office and arrange for an appointment.  Return to emergency department for any fever, vision changes, chest pain, difficulty breathing, numbness or weakness of your arms or legs, inability to walk, nausea, vomiting, worsening headache or any other worsening or concerning symptoms.

## 2017-12-21 ENCOUNTER — Other Ambulatory Visit: Payer: Self-pay

## 2017-12-21 ENCOUNTER — Encounter (HOSPITAL_COMMUNITY): Payer: Self-pay | Admitting: *Deleted

## 2017-12-21 ENCOUNTER — Emergency Department (HOSPITAL_COMMUNITY)
Admission: EM | Admit: 2017-12-21 | Discharge: 2017-12-21 | Disposition: A | Discharge status: Home / Self Care | Payer: BLUE CROSS/BLUE SHIELD | Attending: Emergency Medicine | Admitting: Emergency Medicine

## 2017-12-21 DIAGNOSIS — R509 Fever, unspecified: Secondary | ICD-10-CM | POA: Diagnosis present

## 2017-12-21 DIAGNOSIS — J069 Acute upper respiratory infection, unspecified: Secondary | ICD-10-CM | POA: Insufficient documentation

## 2017-12-21 DIAGNOSIS — M7918 Myalgia, other site: Secondary | ICD-10-CM | POA: Insufficient documentation

## 2017-12-21 DIAGNOSIS — Z79899 Other long term (current) drug therapy: Secondary | ICD-10-CM | POA: Diagnosis not present

## 2017-12-21 DIAGNOSIS — B349 Viral infection, unspecified: Secondary | ICD-10-CM | POA: Insufficient documentation

## 2017-12-21 MED ORDER — SALINE SPRAY 0.65 % NA SOLN: 1.0000 | NASAL | 0 refills | Status: DC | PRN

## 2017-12-21 MED ORDER — BENZONATATE 100 MG PO CAPS: 200.0000 mg | ORAL_CAPSULE | Freq: Three times a day (TID) | ORAL | 0 refills | Status: DC

## 2017-12-21 MED ORDER — NAPROXEN 500 MG PO TABS: 500.0000 mg | ORAL_TABLET | Freq: Two times a day (BID) | ORAL | 0 refills | Status: DC

## 2017-12-21 NOTE — ED Provider Notes (Signed)
[facility]ENC[facility] Provider Note   CSN: [id number] Arrival date & time: 12/21/17  [id number]     History   Chief Complaint Chief Complaint  Patient presents with  . Fever  . bodyaches    HPI [name] is a 52 y.o. female with a past medical history of GERD, who presents to ED for evaluation of 3-day history of subjective fever, cough, nasal congestion and generalized body aches.  She took 1 dose of ibuprofen yesterday with no improvement in her symptoms.  No sick contacts at home with similar symptoms.  She did not receive her influenza vaccine this year.  Denies any nausea, vomiting, chest pain, shortness of breath, hemoptysis, sore throat or ear pain.  HPI  Past Medical History:  Diagnosis Date  . GERD (gastroesophageal reflux disease)   . Knee pain   . Lower back pain     Patient Active Problem List   Diagnosis Date Noted  . Right shoulder pain 07/24/2016  . Atypical chest pain 06/17/2016  . Osteoarthritis of right knee 04/21/2016  . Left shoulder pain 05/15/2014  . Low back pain 12/25/2013  . Right hip pain 11/29/2013  . Headache(784.0) 01/22/2013  . Hyperlipidemia LDL goal < 160 07/12/2012  . supraspinatus tendon partial tear 06/26/2012  . Language barrier 12/08/2011  . Ganglion cyst 12/08/2011  . Obesity (BMI 30.0-34.9) 12/08/2011    Past Surgical History:  Procedure Laterality Date  . knee injections    . shoulder injections      OB History   None      Home Medications    Prior to Admission medications   Medication Sig Start Date End Date Taking? Authorizing Provider  benzonatate (TESSALON) 100 MG capsule Take 2 capsules (200 mg total) by mouth every 8 (eight) hours. 12/21/17   Dietrich PatesKhatri, Pallie Swigert, PA-C  diclofenac (VOLTAREN) 75 MG EC tablet  11/21/16   [provider]  fluticasone (FLONASE) 50 MCG/ACT nasal spray Place 1 spray into both nostrils daily. 11/29/17   Maxwell CaulLayden, Lindsey A, PA-C  ibuprofen (ADVIL,MOTRIN) 600 MG  tablet Take 1 tablet (600 mg total) by mouth every 8 (eight) hours as needed. 05/23/16   Ralene Corkraper, Timothy R, DO  indomethacin (INDOCIN) 50 MG capsule Take 1 capsule (50 mg total) by mouth 3 (three) times daily with meals. 11/04/17   Ward, Chase PicketJaime Pilcher, PA-C  Multiple Vitamin (MULTIVITAMIN) tablet Take 1 tablet by mouth daily. Doesn't take daily    [provider]  naproxen (NAPROSYN) 500 MG tablet Take 1 tablet (500 mg total) by mouth 2 (two) times daily. 12/21/17   Shreyan Hinz, PA-C  predniSONE (DELTASONE) 20 MG tablet Take 2 tablets (40 mg total) by mouth daily. 11/04/17   Ward, Chase PicketJaime Pilcher, PA-C  ranitidine (ZANTAC) 150 MG tablet Take 1 tablet (150 mg total) by mouth 2 (two) times daily. 06/17/16   Ardith DarkParker, Caleb M, MD  sodium chloride (OCEAN) 0.65 % SOLN nasal spray Place 1 spray into both nostrils as needed for congestion. 12/21/17   Dietrich PatesKhatri, Cha Gomillion, PA-C  tiZANidine (ZANAFLEX) 4 MG tablet  11/21/16   [provider]    Family History Family History  Problem Relation Age of Onset  . Sudden death Neg Hx   . Hyperlipidemia Neg Hx   . Heart attack Neg Hx   . Diabetes Neg Hx   . Hypertension Neg Hx   . Heart disease Neg Hx   . Colon cancer Neg Hx   . Colon polyps Neg Hx   .  Esophageal cancer Neg Hx   . Rectal cancer Neg Hx   . Stomach cancer Neg Hx     Social History Social History   Tobacco Use  . Smoking status: Never Smoker  . Smokeless tobacco: Never Used  Substance Use Topics  . Alcohol use: No  . Drug use: No     Allergies   Patient has no known allergies.   Review of Systems Review of Systems  Constitutional: Positive for appetite change and chills. Negative for fever.  HENT: Positive for congestion and rhinorrhea. Negative for ear pain, facial swelling, sore throat, trouble swallowing and voice change.   Respiratory: Positive for cough. Negative for shortness of breath.   Cardiovascular: Negative for chest pain.  Gastrointestinal: Negative for nausea  and vomiting.  Neurological: Negative for weakness and numbness.     Physical Exam Updated Vital Signs BP (!) 130/99 (BP Location: Right Arm)   Pulse 80   Temp 98.9 F (37.2 C) (Oral)   Resp 16   SpO2 100%   Physical Exam  Constitutional: She appears well-developed and well-nourished. No distress.  HENT:  Head: Normocephalic and atraumatic.  Right Ear: A middle ear effusion is present.  Left Ear: A middle ear effusion is present.  Nose: Rhinorrhea present.  Mouth/Throat: Uvula is midline and oropharynx is clear and moist. No tonsillar exudate.  Eyes: Conjunctivae and EOM are normal. No scleral icterus.  Neck: Normal range of motion.  Cardiovascular: Normal rate, regular rhythm and normal heart sounds.  Pulmonary/Chest: Effort normal and breath sounds normal. No respiratory distress.  Neurological: She is alert.  Skin: No rash noted. She is not diaphoretic.  Psychiatric: She has a normal mood and affect.  Nursing note and vitals reviewed.    ED Treatments / Results  Labs (all labs ordered are listed, but only abnormal results are displayed) Labs Reviewed - No data to display  EKG  EKG Interpretation None       Radiology No results found.  Procedures Procedures (including critical care time)  Medications Ordered in ED Medications - No data to display   Initial Impression / Assessment and Plan / ED Course  I have reviewed the triage vital signs and the nursing notes.  Pertinent labs & imaging results that were available during my care of the patient were reviewed by me and considered in my medical decision making (see chart for details).     Patient presents to ED for evaluation of 3-day history of subjective fever, dry cough, nasal congestion and generalized body aches.  She denies any shortness of breath, chest pain, nausea, vomiting.  On physical exam she is overall well-appearing.  She is satting at 100% on room air with clear lung sounds bilaterally.   She is afebrile with no recent use of antipyretics.  She is not tachycardic or tachypneic.  Suspect that her symptoms are viral in nature.  I doubt pneumonia, other bacterial cause of her symptoms.  Will give patient prescription for antitussives, anti-inflammatories and nasal spray to help with congestion.  Advised to return for any severe or worsening symptoms.  Portions of this note were generated with Scientist, clinical (histocompatibility and immunogenetics). Dictation errors may occur despite best attempts at proofreading.   Final Clinical Impressions(s) / ED Diagnoses   Final diagnoses:  Viral upper respiratory tract infection    ED Discharge Orders        Ordered    benzonatate (TESSALON) 100 MG capsule  Every 8 hours     12/21/17  1215    naproxen (NAPROSYN) 500 MG tablet  2 times daily     12/21/17 1215    sodium chloride (OCEAN) 0.65 % SOLN nasal spray  As needed     12/21/17 1215       Dietrich Pates, PA-C 12/21/17 1215    Gerhard Munch, MD 12/21/17 1601

## 2017-12-21 NOTE — ED Triage Notes (Addendum)
To ED for eval of fever, cough, congestion, and body aches for past 3 days. Taking OTC meds intermittently without relief. Appears in nad. No vomiting. No diarrhea

## 2018-01-01 ENCOUNTER — Ambulatory Visit: Payer: BLUE CROSS/BLUE SHIELD

## 2018-01-24 ENCOUNTER — Ambulatory Visit (INDEPENDENT_AMBULATORY_CARE_PROVIDER_SITE_OTHER): Payer: BLUE CROSS/BLUE SHIELD | Admitting: Family Medicine

## 2018-01-24 ENCOUNTER — Encounter: Payer: Self-pay | Admitting: Family Medicine

## 2018-01-24 DIAGNOSIS — M7062 Trochanteric bursitis, left hip: Secondary | ICD-10-CM | POA: Insufficient documentation

## 2018-01-24 MED ORDER — METHYLPREDNISOLONE ACETATE 40 MG/ML IJ SUSP
40.0000 mg | Freq: Once | INTRAMUSCULAR | Status: AC
  Administered 2018-01-24: 40 mg via INTRA_ARTICULAR

## 2018-01-24 NOTE — Assessment & Plan Note (Signed)
Patient is here with signs and symptoms consistent with greater trochanteric bursitis of the left hip.  Insidious onset.  Patient desires injection today.  No red flag symptoms.  No radiation of pain. -Injection performed today and patient tolerated well. -Encouraged regular ice and anti-inflammatories as needed. -Discussed stretching exercises once symptoms have calmed down. -Follow-up PRN

## 2018-01-24 NOTE — Progress Notes (Signed)
   HPI  CC: Left hip pain Patient is here with complaints of left-sided hip pain over the past 2 days.  She denies any trauma, injury, or event which may have caused this pain.  Pain is located along the lateral aspect of the left hip.  Pain does not radiate.  Pain is exquisitely tender to palpation and is making walking difficult.  She states that she is unable to sleep on the side.  She endorses having a previous episode similar to this in the past multiple years ago.  At that time she received a injection and her pain went away very quickly.  She endorses interest in a similar treatment if possible.  She denies any radiation of pain.  No weakness, numbness, or paresthesias.  No erythema, ecchymosis, or swelling.  Medications/Interventions Tried: Relative rest  See HPI and/or previous note for associated ROS.  Objective: BP 130/86   Ht 5\' 2"  (1.575 m)   Wt 202 lb (91.6 kg)   BMI 36.95 kg/m  Gen: NAD, well groomed, a/o x3, normal affect.  CV: Well-perfused. Warm.  Resp: Non-labored.  Neuro: Sensation intact throughout. No gross coordination deficits.  Gait: Nonpathologic posture, antalgic gait without signs of balance issues. Hip, Left: TTP noted at the greater trochanter. No obvious rash, erythema, ecchymosis, or edema. ROM full in all directions; Strength 5/5. Pelvic alignment unremarkable to inspection and palpation. Standing hip rotation and gait without trendelenburg / unsteadiness. No tenderness over piriformis. No SI joint tenderness and normal minimal SI movement.  INJECTION: Left greater trochanteric bursa Patient was given informed consent, signed copy in the chart. Appropriate time out was taken. Area prepped and draped in usual sterile fashion. 2 cc of methylprednisolone 40 mg/ml plus  3 cc of 1% lidocaine without epinephrine was injected into the greater trochanteric bursa using a(n) lateral approach. The patient tolerated the procedure well. There were no complications. Post  procedure instructions were given.   Assessment and plan:  Greater trochanteric bursitis of left hip Patient is here with signs and symptoms consistent with greater trochanteric bursitis of the left hip.  Insidious onset.  Patient desires injection today.  No red flag symptoms.  No radiation of pain. -Injection performed today and patient tolerated well. -Encouraged regular ice and anti-inflammatories as needed. -Discussed stretching exercises once symptoms have calmed down. -Follow-up PRN   Meds ordered this encounter  Medications  . methylPREDNISolone acetate (DEPO-MEDROL) injection 40 mg  . methylPREDNISolone acetate (DEPO-MEDROL) injection 40 mg     Kathee DeltonIan D Isael Stille, MD,MS [facility] Fellow 01/24/2018 5:50 PM

## 2018-03-02 ENCOUNTER — Ambulatory Visit
Admission: RE | Admit: 2018-03-02 | Discharge: 2018-03-02 | Disposition: A | Discharge status: Home / Self Care | Payer: BLUE CROSS/BLUE SHIELD | Source: Ambulatory Visit | Attending: Internal Medicine | Admitting: Internal Medicine

## 2018-03-02 ENCOUNTER — Other Ambulatory Visit: Payer: Self-pay | Admitting: Internal Medicine

## 2018-03-02 ENCOUNTER — Ambulatory Visit: Payer: BLUE CROSS/BLUE SHIELD

## 2018-03-02 DIAGNOSIS — Z1231 Encounter for screening mammogram for malignant neoplasm of breast: Secondary | ICD-10-CM

## 2018-05-28 ENCOUNTER — Emergency Department (HOSPITAL_COMMUNITY)
Admission: EM | Admit: 2018-05-28 | Discharge: 2018-05-28 | Disposition: A | Discharge status: Home / Self Care | Payer: BLUE CROSS/BLUE SHIELD | Attending: Emergency Medicine | Admitting: Emergency Medicine

## 2018-05-28 ENCOUNTER — Encounter (HOSPITAL_COMMUNITY): Payer: Self-pay | Admitting: Emergency Medicine

## 2018-05-28 ENCOUNTER — Emergency Department (HOSPITAL_COMMUNITY): Payer: BLUE CROSS/BLUE SHIELD

## 2018-05-28 DIAGNOSIS — Z79899 Other long term (current) drug therapy: Secondary | ICD-10-CM | POA: Insufficient documentation

## 2018-05-28 DIAGNOSIS — M25521 Pain in right elbow: Secondary | ICD-10-CM

## 2018-05-28 MED ORDER — ACETAMINOPHEN 325 MG PO TABS
650.0000 mg | ORAL_TABLET | Freq: Once | ORAL | Status: AC
  Administered 2018-05-28: 650 mg via ORAL
  Filled 2018-05-28: qty 2

## 2018-05-28 MED ORDER — TRAMADOL HCL 50 MG PO TABS: 50.0000 mg | ORAL_TABLET | Freq: Four times a day (QID) | ORAL | 0 refills | Status: DC | PRN

## 2018-05-28 MED ORDER — PREDNISONE 20 MG PO TABS: ORAL_TABLET | ORAL | 0 refills | Status: DC

## 2018-05-28 NOTE — ED Provider Notes (Signed)
[facility] EMERGENCY DEPARTMENT Provider Note   CSN: [id number] Arrival date & time: 05/28/18  1543     History   Chief Complaint Chief Complaint  Patient presents with  . Arm Pain    HPI [name] is a 52 y.o. female.  Patient is a 52 year old female who presents with right elbow pain.  She states is been hurting for the last 2 to 3 days.  She denies any known injury.  She does not appreciate any known swelling.  She has had some similar pain in her wrist and left elbow in the past.  She states in the past that responded to steroids.  She denies any known history of gout or other joint disorders.  No known fevers.  She does not take any medications at home for the symptoms.     Past Medical History:  Diagnosis Date  . GERD (gastroesophageal reflux disease)   . Knee pain   . Lower back pain     Patient Active Problem List   Diagnosis Date Noted  . Greater trochanteric bursitis of left hip 01/24/2018  . Right shoulder pain 07/24/2016  . Atypical chest pain 06/17/2016  . Osteoarthritis of right knee 04/21/2016  . Left shoulder pain 05/15/2014  . Low back pain 12/25/2013  . Right hip pain 11/29/2013  . Headache(784.0) 01/22/2013  . Hyperlipidemia LDL goal < 160 07/12/2012  . supraspinatus tendon partial tear 06/26/2012  . Language barrier 12/08/2011  . Ganglion cyst 12/08/2011  . Obesity (BMI 30.0-34.9) 12/08/2011    Past Surgical History:  Procedure Laterality Date  . knee injections    . shoulder injections       OB History   None      Home Medications    Prior to Admission medications   Medication Sig Start Date End Date Taking? Authorizing Provider  diclofenac (VOLTAREN) 75 MG EC tablet  11/21/16   [provider]  Multiple Vitamin (MULTIVITAMIN) tablet Take 1 tablet by mouth daily. Doesn't take daily    [provider]  pantoprazole (PROTONIX) 40 MG tablet Take 40 mg by mouth daily.    [provider]    predniSONE (DELTASONE) 20 MG tablet 3 tabs po day one, then 2 tabs daily x 4 days 05/28/18   Rolan Bucco, MD  tiZANidine (ZANAFLEX) 4 MG tablet  11/21/16   [provider]  traMADol (ULTRAM) 50 MG tablet Take 1 tablet (50 mg total) by mouth every 6 (six) hours as needed. 05/28/18   Rolan Bucco, MD    Family History Family History  Problem Relation Age of Onset  . Sudden death Neg Hx   . Hyperlipidemia Neg Hx   . Heart attack Neg Hx   . Diabetes Neg Hx   . Hypertension Neg Hx   . Heart disease Neg Hx   . Colon cancer Neg Hx   . Colon polyps Neg Hx   . Esophageal cancer Neg Hx   . Rectal cancer Neg Hx   . Stomach cancer Neg Hx     Social History Social History   Tobacco Use  . Smoking status: Never Smoker  . Smokeless tobacco: Never Used  Substance Use Topics  . Alcohol use: No  . Drug use: No     Allergies   Patient has no known allergies.   Review of Systems Review of Systems  Constitutional: Negative for fever.  Gastrointestinal: Negative for nausea and vomiting.  Musculoskeletal: Positive for arthralgias. Negative for back  pain, joint swelling and neck pain.  Skin: Negative for wound.  Neurological: Negative for weakness, numbness and headaches.     Physical Exam Updated Vital Signs BP 129/81 (BP Location: Left Arm)   Pulse 85   Temp 98.5 F (36.9 C) (Oral)   Resp 20   SpO2 98%   Physical Exam  Constitutional: She is oriented to person, place, and time. She appears well-developed and well-nourished.  HENT:  Head: Normocephalic and atraumatic.  Neck: Normal range of motion. Neck supple.  Cardiovascular: Normal rate.  Pulmonary/Chest: Effort normal.  Musculoskeletal: She exhibits edema and tenderness.  Patient has a mild tenderness to the posterior aspect of the right elbow.  There is no inflamed bursa sac.  There is pain with range of motion of the elbow joint but not particularly on supination and pronation.  I do not appreciate a joint  effusion.  There is no warmth or erythema to the area.  She is neurovascularly intact distally.  Neurological: She is alert and oriented to person, place, and time.  Skin: Skin is warm and dry.  Psychiatric: She has a normal mood and affect.     ED Treatments / Results  Labs (all labs ordered are listed, but only abnormal results are displayed) Labs Reviewed - No data to display  EKG None  Radiology Dg Elbow Complete Right  Result Date: 05/28/2018 CLINICAL DATA:  Posterior right elbow pain. No injury. EXAM: RIGHT ELBOW - COMPLETE 3+ VIEW COMPARISON:  None. FINDINGS: There is no evidence of acute fracture, dislocation, or elbow joint effusion. There is mild spurring at the medial humeral condyle. Tiny spurs or soft tissue calcifications are noted at the lateral humeral condyle and posterior to the olecranon. No osseous erosion is seen. IMPRESSION: No acute osseous abnormality. Electronically Signed   By: Sebastian AcheAllen  Grady M.D.   On: 05/28/2018 17:32    Procedures Procedures (including critical care time)  Medications Ordered in ED Medications  acetaminophen (TYLENOL) tablet 650 mg (has no administration in time range)     Initial Impression / Assessment and Plan / ED Course  I have reviewed the triage vital signs and the nursing notes.  Pertinent labs & imaging results that were available during my care of the patient were reviewed by me and considered in my medical decision making (see chart for details).     Patient presents with right elbow pain.  There is some mild swelling but I do not appreciate an effusion.  I do not see any other suggestions of septic joint.  X-rays are unremarkable.  She was started on prednisone and tramadol and encouraged to follow-up with her PCP.  She is also provided a right shoulder sling for comfort.  Final Clinical Impressions(s) / ED Diagnoses   Final diagnoses:  Right elbow pain    ED Discharge Orders         Ordered    predniSONE  (DELTASONE) 20 MG tablet     05/28/18 1817    traMADol (ULTRAM) 50 MG tablet  Every 6 hours PRN     05/28/18 1817           Rolan BuccoBelfi, Joniece Smotherman, MD 05/28/18 Rickey [id number]

## 2018-05-28 NOTE — ED Triage Notes (Signed)
Patient to ED c/o R elbow pain onset yesterday, atraumatic. Reports similar pain has occurred in her wrist before and the medications she was given helped. She reports pain worse with movement, unable to bend all the way d/t pain. Radial pulses equal bilaterally.

## 2018-05-28 NOTE — ED Provider Notes (Signed)
Patient placed in Quick Look pathway, seen and evaluated   Chief Complaint: Elbow Pain   HPI:   52 y.o. female who presents for evaluation of right elbow pain that began yesterday.  Denies any preceding trauma, injury.  Has not noticed any warmth or redness.  She has not any fever.  Patient reports that she had similar pain in the wrist was given prednisone which she states helped.  That the arm hurts to move.  Patient denies any fever, numbness/weakness.  ROS: Elbow Pain  Physical Exam:   Gen: No distress  Neuro: Awake and Alert  Skin: Warm    Focused Exam: Tenderness palpation noted to the lateral aspect of the right elbow.  No deformity or crepitus.  No overlying warmth or erythema.  Limited range of motion secondary to pain.  2+ radial pulses bilaterally.   Initiation of care has begun. The patient has been counseled on the process, plan, and necessity for staying for the completion/evaluation, and the remainder of the medical screening examination    Maxwell CaulLayden, Zebulan Hinshaw A, PA-C 05/28/18 1656    Rolan BuccoBelfi, Melanie, MD 05/28/18 2232

## 2018-07-11 ENCOUNTER — Emergency Department (HOSPITAL_COMMUNITY)
Admission: EM | Admit: 2018-07-11 | Discharge: 2018-07-12 | Disposition: A | Discharge status: Home / Self Care | Payer: BLUE CROSS/BLUE SHIELD | Attending: Emergency Medicine | Admitting: Emergency Medicine

## 2018-07-11 ENCOUNTER — Other Ambulatory Visit: Payer: Self-pay

## 2018-07-11 ENCOUNTER — Encounter (HOSPITAL_COMMUNITY): Payer: Self-pay | Admitting: Emergency Medicine

## 2018-07-11 DIAGNOSIS — K219 Gastro-esophageal reflux disease without esophagitis: Secondary | ICD-10-CM | POA: Insufficient documentation

## 2018-07-11 DIAGNOSIS — T17208A Unspecified foreign body in pharynx causing other injury, initial encounter: Secondary | ICD-10-CM | POA: Diagnosis present

## 2018-07-11 DIAGNOSIS — J358 Other chronic diseases of tonsils and adenoids: Secondary | ICD-10-CM | POA: Diagnosis not present

## 2018-07-11 DIAGNOSIS — M1711 Unilateral primary osteoarthritis, right knee: Secondary | ICD-10-CM | POA: Insufficient documentation

## 2018-07-11 DIAGNOSIS — Z79899 Other long term (current) drug therapy: Secondary | ICD-10-CM | POA: Diagnosis not present

## 2018-07-11 DIAGNOSIS — E785 Hyperlipidemia, unspecified: Secondary | ICD-10-CM | POA: Diagnosis not present

## 2018-07-11 DIAGNOSIS — Z6833 Body mass index (BMI) 33.0-33.9, adult: Secondary | ICD-10-CM | POA: Insufficient documentation

## 2018-07-11 DIAGNOSIS — E669 Obesity, unspecified: Secondary | ICD-10-CM | POA: Insufficient documentation

## 2018-07-11 DIAGNOSIS — T17228A Food in pharynx causing other injury, initial encounter: Secondary | ICD-10-CM

## 2018-07-11 NOTE — ED Triage Notes (Signed)
Pt reports she was eating soup for dinner tonight that contained fish bones. Pt reports a fish bone is lodged in her throat still and has been unable to get it out/swallow it. Pt reports 4/10 throat pain. Denies sob or blocked airway.

## 2018-07-12 ENCOUNTER — Emergency Department (HOSPITAL_COMMUNITY): Payer: BLUE CROSS/BLUE SHIELD | Admitting: Anesthesiology

## 2018-07-12 ENCOUNTER — Encounter (HOSPITAL_COMMUNITY): Payer: Self-pay | Admitting: Anesthesiology

## 2018-07-12 ENCOUNTER — Encounter (HOSPITAL_COMMUNITY)
Admission: EM | Disposition: A | Discharge status: Home / Self Care | Payer: Self-pay | Source: Home / Self Care | Attending: Emergency Medicine

## 2018-07-12 ENCOUNTER — Emergency Department (HOSPITAL_COMMUNITY): Payer: BLUE CROSS/BLUE SHIELD

## 2018-07-12 DIAGNOSIS — T17208A Unspecified foreign body in pharynx causing other injury, initial encounter: Secondary | ICD-10-CM

## 2018-07-12 HISTORY — PX: DIRECT LARYNGOSCOPY: SHX5326

## 2018-07-12 LAB — BASIC METABOLIC PANEL
Anion gap: 6 (ref 5–15)
BUN: 9 mg/dL (ref 6–20)
CHLORIDE: 104 mmol/L (ref 98–111)
CO2: 28 mmol/L (ref 22–32)
Calcium: 9.1 mg/dL (ref 8.9–10.3)
Creatinine, Ser: 0.63 mg/dL (ref 0.44–1.00)
GFR calc Af Amer: >60 mL/min (ref >60)
GFR calc non Af Amer: >60 mL/min (ref >60)
Glucose, Bld: 99 mg/dL (ref 70–99)
POTASSIUM: 3.8 mmol/L (ref 3.5–5.1)
SODIUM: 138 mmol/L (ref 135–145)

## 2018-07-12 LAB — CBC WITH DIFFERENTIAL/PLATELET
ABS IMMATURE GRANULOCYTES: 0.01 10*3/uL (ref 0.00–0.07)
Basophils Absolute: 0 10*3/uL (ref 0.0–0.1)
Basophils Relative: 0 %
Eosinophils Absolute: 0.1 10*3/uL (ref 0.0–0.5)
Eosinophils Relative: 2 %
HCT: 34.5 % — ABNORMAL LOW (ref 36.0–46.0)
HEMOGLOBIN: 11 g/dL — AB (ref 12.0–15.0)
Immature Granulocytes: 0 %
LYMPHS ABS: 3.2 10*3/uL (ref 0.7–4.0)
Lymphocytes Relative: 57 %
MCH: 28.3 pg (ref 26.0–34.0)
MCHC: 31.9 g/dL (ref 30.0–36.0)
MCV: 88.7 fL (ref 80.0–100.0)
MONO ABS: 0.6 10*3/uL (ref 0.1–1.0)
MONOS PCT: 11 %
NEUTROS ABS: 1.7 10*3/uL (ref 1.7–7.7)
Neutrophils Relative %: 30 %
Platelets: 265 10*3/uL (ref 150–400)
RBC: 3.89 MIL/uL (ref 3.87–5.11)
RDW: 12.8 % (ref 11.5–15.5)
WBC: 5.7 10*3/uL (ref 4.0–10.5)
nRBC: 0 % (ref 0.0–0.2)

## 2018-07-12 SURGERY — LARYNGOSCOPY, DIRECT
Anesthesia: General | Site: Mouth

## 2018-07-12 MED ORDER — OXYCODONE HCL 5 MG/5ML PO SOLN: 5.0000 mg | Freq: Once | ORAL | Status: DC | PRN

## 2018-07-12 MED ORDER — EPINEPHRINE HCL (NASAL) 0.1 % NA SOLN
NASAL | Status: AC
  Filled 2018-07-12: qty 30

## 2018-07-12 MED ORDER — 0.9 % SODIUM CHLORIDE (POUR BTL) OPTIME
TOPICAL | Status: DC | PRN
  Administered 2018-07-12: 1000 mL

## 2018-07-12 MED ORDER — FENTANYL CITRATE (PF) 250 MCG/5ML IJ SOLN
INTRAMUSCULAR | Status: AC
  Filled 2018-07-12: qty 5

## 2018-07-12 MED ORDER — FENTANYL CITRATE (PF) 100 MCG/2ML IJ SOLN: 25.0000 ug | INTRAMUSCULAR | Status: DC | PRN

## 2018-07-12 MED ORDER — MIDAZOLAM HCL 5 MG/5ML IJ SOLN
INTRAMUSCULAR | Status: DC | PRN
  Administered 2018-07-12: 2 mg via INTRAVENOUS

## 2018-07-12 MED ORDER — SUCCINYLCHOLINE CHLORIDE 20 MG/ML IJ SOLN
INTRAMUSCULAR | Status: DC | PRN
  Administered 2018-07-12: 120 mg via INTRAVENOUS

## 2018-07-12 MED ORDER — ONDANSETRON HCL 4 MG/2ML IJ SOLN
INTRAMUSCULAR | Status: DC | PRN
  Administered 2018-07-12: 4 mg via INTRAVENOUS

## 2018-07-12 MED ORDER — LACTATED RINGERS IV SOLN
INTRAVENOUS | Status: DC | PRN
  Administered 2018-07-12: 05:00:00 via INTRAVENOUS

## 2018-07-12 MED ORDER — PROPOFOL 10 MG/ML IV BOLUS
INTRAVENOUS | Status: DC | PRN
  Administered 2018-07-12: 200 mg via INTRAVENOUS

## 2018-07-12 MED ORDER — OXYCODONE HCL 5 MG PO TABS: 5.0000 mg | ORAL_TABLET | Freq: Once | ORAL | Status: DC | PRN

## 2018-07-12 MED ORDER — OXYMETAZOLINE HCL 0.05 % NA SOLN
NASAL | Status: AC
  Filled 2018-07-12: qty 15

## 2018-07-12 MED ORDER — LIDOCAINE HCL (CARDIAC) PF 100 MG/5ML IV SOSY
PREFILLED_SYRINGE | INTRAVENOUS | Status: DC | PRN
  Administered 2018-07-12: 80 mg via INTRATRACHEAL

## 2018-07-12 MED ORDER — FENTANYL CITRATE (PF) 250 MCG/5ML IJ SOLN
INTRAMUSCULAR | Status: DC | PRN
  Administered 2018-07-12: 50 ug via INTRAVENOUS

## 2018-07-12 MED ORDER — MIDAZOLAM HCL 2 MG/2ML IJ SOLN
INTRAMUSCULAR | Status: AC
  Filled 2018-07-12: qty 2

## 2018-07-12 MED ORDER — PROPOFOL 10 MG/ML IV BOLUS
INTRAVENOUS | Status: AC
  Filled 2018-07-12: qty 40

## 2018-07-12 MED ORDER — LIDOCAINE VISCOUS HCL 2 % MT SOLN
15.0000 mL | Freq: Once | OROMUCOSAL | Status: AC
  Administered 2018-07-12: 15 mL via OROMUCOSAL
  Filled 2018-07-12: qty 15

## 2018-07-12 MED ORDER — ONDANSETRON HCL 4 MG/2ML IJ SOLN: 4.0000 mg | Freq: Once | INTRAMUSCULAR | Status: DC | PRN

## 2018-07-12 SURGICAL SUPPLY — 18 items
CANISTER SUCT 3000ML PPV (MISCELLANEOUS) ×3 IMPLANT
CONT SPEC 4OZ CLIKSEAL STRL BL (MISCELLANEOUS) ×6 IMPLANT
COVER BACK TABLE 60X90IN (DRAPES) ×3 IMPLANT
COVER WAND RF STERILE (DRAPES) IMPLANT
DRAPE HALF SHEET 40X57 (DRAPES) ×3 IMPLANT
GAUZE SPONGE 4X4 12PLY STRL LF (GAUZE/BANDAGES/DRESSINGS) ×3 IMPLANT
GLOVE BIO SURGEON STRL SZ7.5 (GLOVE) ×3 IMPLANT
GOWN STRL REUS W/ TWL LRG LVL3 (GOWN DISPOSABLE) ×4 IMPLANT
GOWN STRL REUS W/TWL LRG LVL3 (GOWN DISPOSABLE) ×2
GUARD TEETH (MISCELLANEOUS) ×3 IMPLANT
KIT BASIN OR (CUSTOM PROCEDURE TRAY) ×3 IMPLANT
KIT TURNOVER KIT B (KITS) ×3 IMPLANT
NEEDLE HYPO 25GX1X1/2 BEV (NEEDLE) IMPLANT
NS IRRIG 1000ML POUR BTL (IV SOLUTION) ×3 IMPLANT
PAD ARMBOARD 7.5X6 YLW CONV (MISCELLANEOUS) ×6 IMPLANT
PATTIES SURGICAL .5 X1 (DISPOSABLE) IMPLANT
SOLUTION ANTI FOG 6CC (MISCELLANEOUS) ×3 IMPLANT
TUBE CONNECTING 12X1/4 (SUCTIONS) ×3 IMPLANT

## 2018-07-12 NOTE — Transfer of Care (Signed)
Immediate Anesthesia Transfer of Care Note  Patient: [name]  Procedure(s) Performed: DIRECT LARYNGOSCOPY (N/A Mouth)  Patient Location: PACU  Anesthesia Type:General  Level of Consciousness: drowsy  Airway & Oxygen Therapy: Patient Spontanous Breathing  Post-op Assessment: Report given to RN and Post -op Vital signs reviewed and stable  Post vital signs: Reviewed and stable  Last Vitals:  Vitals Value Taken Time  BP    Temp    Pulse 81 07/12/2018  6:02 AM  Resp 11 07/12/2018  6:02 AM  SpO2 97 % 07/12/2018  6:02 AM  Vitals shown include unvalidated device data.  Last Pain:  Vitals:   07/12/18 0113  TempSrc:   PainSc: 5          Complications: No apparent anesthesia complications

## 2018-07-12 NOTE — ED Notes (Signed)
Patient report he ate yesterday at 1730 at work and she felt her throat was stuck with a slight fishbone. No intervention has helped.

## 2018-07-12 NOTE — Anesthesia Postprocedure Evaluation (Signed)
Anesthesia Post Note  Patient: Zandra Mcdanel  Procedure(s) Performed: DIRECT LARYNGOSCOPY (N/A Mouth)     Patient location during evaluation: PACU Anesthesia Type: General Level of consciousness: awake and alert Pain management: pain level controlled Vital Signs Assessment: post-procedure vital signs reviewed and stable Respiratory status: spontaneous breathing, nonlabored ventilation, respiratory function stable and patient connected to nasal cannula oxygen Cardiovascular status: blood pressure returned to baseline and stable Postop Assessment: no apparent nausea or vomiting Anesthetic complications: no    Last Vitals:  Vitals:   07/12/18 0630 07/12/18 0645  BP: (!) 145/101 (!) 151/98  Pulse: 68 66  Resp: 13 13  Temp:  36.7 C  SpO2: 96% 97%    Last Pain:  Vitals:   07/12/18 0645  TempSrc:   PainSc: 0-No pain                 Steven Basso COKER

## 2018-07-12 NOTE — Anesthesia Preprocedure Evaluation (Addendum)
Anesthesia Evaluation  Patient identified by MRN, date of birth, ID band Patient awake    Reviewed: Allergy & Precautions, NPO status , Patient's Chart, lab work & pertinent test results  Airway Mallampati: II  TM Distance: >3 FB Neck ROM: Full    Dental  (+) Teeth Intact, Dental Advisory Given   Pulmonary    breath sounds clear to auscultation       Cardiovascular  Rhythm:Regular Rate:Normal     Neuro/Psych    GI/Hepatic   Endo/Other    Renal/GU      Musculoskeletal   Abdominal   Peds  Hematology   Anesthesia Other Findings   Reproductive/Obstetrics                             Anesthesia Physical Anesthesia Plan  ASA: III  Anesthesia Plan: General   Post-op Pain Management:    Induction: Intravenous  PONV Risk Score and Plan: Ondansetron and Dexamethasone  Airway Management Planned: Oral ETT  Additional Equipment:   Intra-op Plan:   Post-operative Plan:   Informed Consent: I have reviewed the patients History and Physical, chart, labs and discussed the procedure including the risks, benefits and alternatives for the proposed anesthesia with the patient or authorized representative who has indicated his/her understanding and acceptance.     Dental advisory given  Plan Discussed with: CRNA and Anesthesiologist  Anesthesia Plan Comments:         Anesthesia Quick Evaluation  

## 2018-07-12 NOTE — Anesthesia Procedure Notes (Signed)
Procedure Name: Intubation Date/Time: 07/12/2018 5:36 AM Performed by: Claudina Lick, CRNA Pre-anesthesia Checklist: Patient identified, Emergency Drugs available, Suction available, Patient being monitored and Timeout performed Patient Re-evaluated:Patient Re-evaluated prior to induction Oxygen Delivery Method: Circle system utilized Preoxygenation: Pre-oxygenation with 100% oxygen Induction Type: IV induction, Rapid sequence and Cricoid Pressure applied Laryngoscope Size: Miller and 2 Grade View: Grade I Tube type: Oral Tube size: 6.5 mm Number of attempts: 1 Airway Equipment and Method: Stylet Placement Confirmation: ETT inserted through vocal cords under direct vision,  positive ETCO2 and breath sounds checked- equal and bilateral Secured at: 21 cm Tube secured with: Tape Dental Injury: Teeth and Oropharynx as per pre-operative assessment

## 2018-07-12 NOTE — H&P (Signed)
Reason for Consult: Foreign body in throat Referring Physician: Joseph Berkshire, MD  HPI:  [name] is an 52 y.o. female who presents to the emergency department stating that she has a fishbone lodged in her throat.  Patient reports that she was eating soup last night when the symptoms began.  She swallowed a piece of fish and then felt like there was something stuck on the right side of her throat.  She put her finger down her throat, vomited several times but the symptoms have not improved.  She reports pain with swallowing with sensation of the bone being in the back of the throat.  No difficulty breathing.  Past Medical History:  Diagnosis Date  . GERD (gastroesophageal reflux disease)   . Knee pain   . Lower back pain     Past Surgical History:  Procedure Laterality Date  . knee injections    . shoulder injections      Family History  Problem Relation Age of Onset  . Sudden death Neg Hx   . Hyperlipidemia Neg Hx   . Heart attack Neg Hx   . Diabetes Neg Hx   . Hypertension Neg Hx   . Heart disease Neg Hx   . Colon cancer Neg Hx   . Colon polyps Neg Hx   . Esophageal cancer Neg Hx   . Rectal cancer Neg Hx   . Stomach cancer Neg Hx     Social History:  reports that she has never smoked. She has never used smokeless tobacco. She reports that she does not drink alcohol or use drugs.  Allergies: No Known Allergies  Prior to Admission medications   Medication Sig Start Date End Date Taking? Authorizing Provider  diclofenac (VOLTAREN) 75 MG EC tablet  11/21/16   [provider]  Multiple Vitamin (MULTIVITAMIN) tablet Take 1 tablet by mouth daily. Doesn't take daily    [provider]  pantoprazole (PROTONIX) 40 MG tablet Take 40 mg by mouth daily.    [provider]  predniSONE (DELTASONE) 20 MG tablet 3 tabs po day one, then 2 tabs daily x 4 days 05/28/18   Malvin Johns, MD  tiZANidine (ZANAFLEX) 4 MG tablet  11/21/16   [provider]  traMADol (ULTRAM) 50 MG tablet Take 1 tablet (50 mg total) by mouth every 6 (six) hours as needed. 05/28/18   Malvin Johns, MD    Results for orders placed or performed during the hospital encounter of 07/11/18 (from the past 48 hour(s))  CBC with Differential/Platelet     Status: Abnormal   Collection Time: 07/12/18  2:33 AM  Result Value Ref Range   WBC 5.7 4.0 - 10.5 K/uL   RBC 3.89 3.87 - 5.11 MIL/uL   Hemoglobin 11.0 (L) 12.0 - 15.0 g/dL   HCT 34.5 (L) 36.0 - 46.0 %   MCV 88.7 80.0 - 100.0 fL   MCH 28.3 26.0 - 34.0 pg   MCHC 31.9 30.0 - 36.0 g/dL   RDW 12.8 11.5 - 15.5 %   Platelets 265 150 - 400 K/uL   nRBC 0.0 0.0 - 0.2 %   Neutrophils Relative % 30 %   Neutro Abs 1.7 1.7 - 7.7 K/uL   Lymphocytes Relative 57 %   Lymphs Abs 3.2 0.7 - 4.0 K/uL   Monocytes Relative 11 %   Monocytes Absolute 0.6 0.1 - 1.0 K/uL   Eosinophils Relative 2 %   Eosinophils Absolute 0.1 0.0 - 0.5 K/uL   Basophils  Relative 0 %   Basophils Absolute 0.0 0.0 - 0.1 K/uL   Immature Granulocytes 0 %   Abs Immature Granulocytes 0.01 0.00 - 0.07 K/uL    Comment: Performed at [facility], [place] [address] [place], [place] [address]  Basic metabolic panel     Status: None   Collection Time: 07/12/18  2:33 AM  Result Value Ref Range   Sodium 138 135 - 145 mmol/L   Potassium 3.8 3.5 - 5.1 mmol/L   Chloride 104 98 - 111 mmol/L   CO2 28 22 - 32 mmol/L   Glucose, Bld 99 70 - 99 mg/dL   BUN 9 6 - 20 mg/dL   Creatinine, Ser 0.63 0.44 - 1.00 mg/dL   Calcium 9.1 8.9 - 10.3 mg/dL   GFR calc non Af Amer >60 >60 mL/min   GFR calc Af Amer >60 >60 mL/min    Comment: (NOTE) The eGFR has been calculated using the CKD EPI equation. This calculation has not been validated in all clinical situations. eGFR's persistently <60 mL/min signify possible Chronic Kidney Disease.    Anion gap 6 5 - 15    Comment: Performed at [facility] [address], [place], [place] [address]    Ct Soft Tissue Neck  Wo Contrast  Result Date: 07/12/2018 CLINICAL DATA:  Ingested fishbone. EXAM: CT NECK WITHOUT CONTRAST TECHNIQUE: Multidetector CT imaging of the neck was performed following the standard protocol without intravenous contrast. COMPARISON:  None. FINDINGS: PHARYNX AND LARYNX: --Nasopharynx: Fossae of Rosenmuller are clear. Normal adenoid tonsils for age. --Oral cavity and oropharynx: There are 2 linear calcified foci measuring 6 mm abutting the right lingual tonsil (coronal image 57, series 5, image 43). --Hypopharynx: Normal vallecula and pyriform sinuses. --Larynx: Normal epiglottis and pre-epiglottic space. Normal aryepiglottic and vocal folds. --Retropharyngeal space: No abscess, effusion or lymphadenopathy. SALIVARY GLANDS: --Parotid: No mass lesion or inflammation. No sialolithiasis or ductal dilatation. --Submandibular: Symmetric without inflammation. No sialolithiasis or ductal dilatation. --Sublingual: Normal. No ranula or other visible lesion of the base of tongue and floor of mouth. THYROID: Normal. LYMPH NODES: No enlarged or abnormal density lymph nodes. VASCULAR: Limited assessment without IV contrast. LIMITED INTRACRANIAL: Normal. VISUALIZED ORBITS: Normal. MASTOIDS AND VISUALIZED PARANASAL SINUSES: Secretions within the left sphenoid sinus. SKELETON: No bony spinal canal stenosis. No lytic or blastic lesions. UPPER CHEST: Clear. OTHER: None. IMPRESSION: Linear, calcific-density foci within the oropharynx, abutting the right lingual tonsil, measuring 6 mm. It is not clear whether either of these is a fishbone, but there are the only abnormal densities along the aerodigestive tract. They are more likely tonsillar calcifications. Electronically Signed   By: Ulyses Jarred M.D.   On: 07/12/2018 03:21   Review of Systems ROS reviewed  - Negative except as stated above   Blood pressure (!) 137/105, pulse 66, temperature 97.8 F (36.6 C), temperature source Oral, resp. rate 16, height '5\' 5"'$  (1.651  m), weight 90.7 kg, SpO2 100 %. General appearance: alert, cooperative and appears stated age Head: Normocephalic, without obvious abnormality, atraumatic  Eyes: Pupils are equal, round, reactive to light. Extraocular motion is intact.  Ears: Examination of the ears shows normal auricles and external auditory canals bilaterally.  Nose: Nasal examination shows normal mucosa, septum, turbinates.  Face: Facial examination shows no asymmetry. Palpation of the face elicit no significant tenderness.  Mouth: Oral cavity examination shows no mucosal lacerations. No significant trismus is noted.  Neck Palpation of the neck reveals no lymphadenopathy or  mass. The trachea is midline. The thyroid is not significantly enlarged.  Neuro: Cranial nerves 2-12 are all grossly in tact.  Assessment/Plan: Foreign body ingestion. CT shows possible fish bone within the lingual tonsil. Plan Direct laryngoscopy and foreign body removal in the operating room under general anesthesia. R/B/A discussed with the patient. Informed consent obtained.  Alexanderia Gorby W Jeralynn Vaquera 07/12/2018, 5:12 AM

## 2018-07-12 NOTE — Op Note (Signed)
DATE OF PROCEDURE:  07/12/2018                              OPERATIVE REPORT  SURGEON:  Newman Pies, MD  PREOPERATIVE DIAGNOSES: 1. Foreign body ingestion  POSTOPERATIVE DIAGNOSES: 1. Foreign body ingestion  PROCEDURE PERFORMED:  Microdirect laryngoscopy  ANESTHESIA:  General endotracheal tube anesthesia.  COMPLICATIONS:  None.  ESTIMATED BLOOD LOSS:  Minimal.  INDICATION FOR PROCEDURE:  [name] is a 53 y.o. female who presented to the emergency department stating that she has a fishbone lodged in her throat. Patient reports that she was eating soup last night when the symptoms began. She swallowed a piece of fish and then felt like there was something stuck on the right side of her throat. Her CT scan shows possible foreign body within the right lingual tonsil. Based on the above findings, the decision was made for the patient to undergo the direct laryngoscopy procedure. Likelihood of success in reducing symptoms was also discussed.  The risks, benefits, alternatives, and details of the procedure were discussed with the patient.  Questions were invited and answered.  Informed consent was obtained.  DESCRIPTION:  The patient was taken to the operating room and placed supine on the operating table.  General endotracheal tube anesthesia was administered by the anesthesiologist.  The patient was positioned and prepped and draped in a standard fashion for direct laryngoscopy.  A Dedo laryngoscope was used for examination. The laryngoscope was inserted via the oral cavity into the pharynx. Examination of the tongue base and vallecula revealed 2 areas of abrasion within the right lingual tonsil. Several tonsilloliths were noted. No foreign body was noted. The areas were closely examined with a 0 rigid endoscope. Photodocumentation of the findings was obtained.  The patient's epiglottis, aryepiglottic folds, piriform sinuses, and laryngeal inlet were all normal.  The care of the patient was  turned over to the anesthesiologist.  The patient was awakened from anesthesia without difficulty.  The patient was extubated and transferred to the recovery room in good condition.  OPERATIVE FINDINGS:  2 areas of abrasion were noted within the right lingual tonsil. No foreign body was noted.  SPECIMEN:  None  FOLLOWUP CARE:  The patient will be discharged home once awake and alert.  She may follow-up with me as an outpatient on a PRN basis.  Docie Abramovich W Jessalyn Hinojosa 07/12/2018 5:54 AM

## 2018-07-12 NOTE — ED Provider Notes (Addendum)
[facility] EMERGENCY DEPARTMENT Provider Note   CSN: [id number] Arrival date & time: 07/11/18  2207     History   Chief Complaint Chief Complaint  Patient presents with  . Fish bone lodged in throat    HPI [name] is a 52 y.o. female.  Patient presents to the emergency department stating that she thinks that she has a fishbone lodged in her throat.  Patient reports that she was eating soup tonight when the symptoms began.  She swallowed a piece of fish and then felt like there was something stuck on the right side of her throat.  She put her finger down her throat, vomited several times but the symptoms have not improved.  She reports pain with swallowing and sensation of the bone being in the back of the throat.  No difficulty breathing.     Past Medical History:  Diagnosis Date  . GERD (gastroesophageal reflux disease)   . Knee pain   . Lower back pain     Patient Active Problem List   Diagnosis Date Noted  . Greater trochanteric bursitis of left hip 01/24/2018  . Right shoulder pain 07/24/2016  . Atypical chest pain 06/17/2016  . Osteoarthritis of right knee 04/21/2016  . Left shoulder pain 05/15/2014  . Low back pain 12/25/2013  . Right hip pain 11/29/2013  . Headache(784.0) 01/22/2013  . Hyperlipidemia LDL goal < 160 07/12/2012  . supraspinatus tendon partial tear 06/26/2012  . Language barrier 12/08/2011  . Ganglion cyst 12/08/2011  . Obesity (BMI 30.0-34.9) 12/08/2011    Past Surgical History:  Procedure Laterality Date  . knee injections    . shoulder injections       OB History   None      Home Medications    Prior to Admission medications   Medication Sig Start Date End Date Taking? Authorizing Provider  diclofenac (VOLTAREN) 75 MG EC tablet  11/21/16   [provider]  Multiple Vitamin (MULTIVITAMIN) tablet Take 1 tablet by mouth daily. Doesn't take daily    [provider]  pantoprazole (PROTONIX) 40  MG tablet Take 40 mg by mouth daily.    [provider]  predniSONE (DELTASONE) 20 MG tablet 3 tabs po day one, then 2 tabs daily x 4 days 05/28/18   Rolan Bucco, MD  tiZANidine (ZANAFLEX) 4 MG tablet  11/21/16   [provider]  traMADol (ULTRAM) 50 MG tablet Take 1 tablet (50 mg total) by mouth every 6 (six) hours as needed. 05/28/18   Rolan Bucco, MD    Family History Family History  Problem Relation Age of Onset  . Sudden death Neg Hx   . Hyperlipidemia Neg Hx   . Heart attack Neg Hx   . Diabetes Neg Hx   . Hypertension Neg Hx   . Heart disease Neg Hx   . Colon cancer Neg Hx   . Colon polyps Neg Hx   . Esophageal cancer Neg Hx   . Rectal cancer Neg Hx   . Stomach cancer Neg Hx     Social History Social History   Tobacco Use  . Smoking status: Never Smoker  . Smokeless tobacco: Never Used  Substance Use Topics  . Alcohol use: No  . Drug use: No     Allergies   Patient has no known allergies.   Review of Systems Review of Systems  HENT: Positive for sore throat.   All other systems reviewed and are negative.  Physical Exam Updated Vital Signs BP (!) 137/105   Pulse 66   Temp 97.8 F (36.6 C) (Oral)   Resp 16   Ht 5\' 5"  (1.651 m)   Wt 90.7 kg   SpO2 100%   BMI 33.28 kg/m   Physical Exam  Constitutional: She is oriented to person, place, and time. She appears well-developed and well-nourished. No distress.  HENT:  Head: Normocephalic and atraumatic.  Right Ear: Hearing normal.  Left Ear: Hearing normal.  Nose: Nose normal.  Mouth/Throat: Oropharynx is clear and moist and mucous membranes are normal.  No foreign body, no oropharyngeal swelling  Eyes: Pupils are equal, round, and reactive to light. Conjunctivae and EOM are normal.  Neck: Normal range of motion. Neck supple.  Cardiovascular: Regular rhythm, S1 normal and S2 normal. Exam reveals no gallop and no friction rub.  No murmur heard. Pulmonary/Chest: Effort normal and  breath sounds normal. No respiratory distress. She exhibits no tenderness.  Abdominal: Soft. Normal appearance and bowel sounds are normal. There is no hepatosplenomegaly. There is no tenderness. There is no rebound, no guarding, no tenderness at McBurney's point and negative Murphy's sign. No hernia.  Musculoskeletal: Normal range of motion.  Neurological: She is alert and oriented to person, place, and time. She has normal strength. No cranial nerve deficit or sensory deficit. Coordination normal. GCS eye subscore is 4. GCS verbal subscore is 5. GCS motor subscore is 6.  Skin: Skin is warm, dry and intact. No rash noted. No cyanosis.  Psychiatric: She has a normal mood and affect. Her speech is normal and behavior is normal. Thought content normal.  Nursing note and vitals reviewed.    ED Treatments / Results  Labs (all labs ordered are listed, but only abnormal results are displayed) Labs Reviewed  CBC WITH DIFFERENTIAL/PLATELET - Abnormal; Notable for the following components:      Result Value   Hemoglobin 11.0 (*)    HCT 34.5 (*)    All other components within normal limits  BASIC METABOLIC PANEL    EKG None  Radiology Ct Soft Tissue Neck Wo Contrast  Result Date: 07/12/2018 CLINICAL DATA:  Ingested fishbone. EXAM: CT NECK WITHOUT CONTRAST TECHNIQUE: Multidetector CT imaging of the neck was performed following the standard protocol without intravenous contrast. COMPARISON:  None. FINDINGS: PHARYNX AND LARYNX: --Nasopharynx: Fossae of Rosenmuller are clear. Normal adenoid tonsils for age. --Oral cavity and oropharynx: There are 2 linear calcified foci measuring 6 mm abutting the right lingual tonsil (coronal image 57, series 5, image 43). --Hypopharynx: Normal vallecula and pyriform sinuses. --Larynx: Normal epiglottis and pre-epiglottic space. Normal aryepiglottic and vocal folds. --Retropharyngeal space: No abscess, effusion or lymphadenopathy. SALIVARY GLANDS: --Parotid: No mass  lesion or inflammation. No sialolithiasis or ductal dilatation. --Submandibular: Symmetric without inflammation. No sialolithiasis or ductal dilatation. --Sublingual: Normal. No ranula or other visible lesion of the base of tongue and floor of mouth. THYROID: Normal. LYMPH NODES: No enlarged or abnormal density lymph nodes. VASCULAR: Limited assessment without IV contrast. LIMITED INTRACRANIAL: Normal. VISUALIZED ORBITS: Normal. MASTOIDS AND VISUALIZED PARANASAL SINUSES: Secretions within the left sphenoid sinus. SKELETON: No bony spinal canal stenosis. No lytic or blastic lesions. UPPER CHEST: Clear. OTHER: None. IMPRESSION: Linear, calcific-density foci within the oropharynx, abutting the right lingual tonsil, measuring 6 mm. It is not clear whether either of these is a fishbone, but there are the only abnormal densities along the aerodigestive tract. They are more likely tonsillar calcifications. Electronically Signed   By: Deatra Robinson  M.D.   On: 07/12/2018 03:21    Procedures Procedures (including critical care time)  Medications Ordered in ED Medications  lidocaine (XYLOCAINE) 2 % viscous mouth solution 15 mL (15 mLs Mouth/Throat Given 07/12/18 0344)     Initial Impression / Assessment and Plan / ED Course  I have reviewed the triage vital signs and the nursing notes.  Pertinent labs & imaging results that were available during my care of the patient were reviewed by me and considered in my medical decision making (see chart for details).     Patient presents to the emergency department with concerns over a fishbone stuck in her throat.  She was eating fish tonight when she felt sensation of something caught in her throat.  She is not in any distress, handling her secretions, breathing without difficulty.  Examination was unremarkable.  CT scan, however, does show a linear opacity in the area where she is complaining of discomfort.  Discussed with Dr. Suszanne Conners, will take patient to the  OR  Final Clinical Impressions(s) / ED Diagnoses   Final diagnoses:  Fishbone in throat    ED Discharge Orders    None       Cricket Goodlin, Canary Brim, MD 07/12/18 0407    Gilda Crease, MD 07/24/18 [id number]

## 2018-07-12 NOTE — Discharge Instructions (Addendum)
The patient may resume all her previous activities and diet. She may follow-up with me as an outpatient if needed.

## 2018-07-13 ENCOUNTER — Encounter (HOSPITAL_COMMUNITY): Payer: Self-pay | Admitting: Otolaryngology

## 2018-09-05 ENCOUNTER — Encounter: Payer: Self-pay | Admitting: Sports Medicine

## 2018-09-05 ENCOUNTER — Ambulatory Visit (INDEPENDENT_AMBULATORY_CARE_PROVIDER_SITE_OTHER): Payer: BLUE CROSS/BLUE SHIELD | Admitting: Sports Medicine

## 2018-09-05 VITALS — BP 129/80 | Ht 62.0 in | Wt 202.0 lb

## 2018-09-05 DIAGNOSIS — M67911 Unspecified disorder of synovium and tendon, right shoulder: Secondary | ICD-10-CM | POA: Diagnosis not present

## 2018-09-05 MED ORDER — METHYLPREDNISOLONE ACETATE 40 MG/ML IJ SUSP
40.0000 mg | Freq: Once | INTRAMUSCULAR | Status: AC
  Administered 2018-09-05: 40 mg via INTRA_ARTICULAR

## 2018-09-05 NOTE — Patient Instructions (Signed)
It was great to see you today for your office visit. I am recommending shoulder exercises to help strengthen your rotator cuff. Perform these daily for the next 4-6 weeks. If your symptoms are not improving, we may need to order further testing and/or refer you to formal physical therapy. You may continue with using Advil as needed for pain and inflammation.

## 2018-09-05 NOTE — Progress Notes (Signed)
[name] - 52 y.o. female MRN [id number]  Date of birth: 11/09/1965   Chief complaint: R shoulder pain  SUBJECTIVE:    History of present illness: 52 yo [place] American female who presents today with the cc of right lateral shoulder pain x 31mo. 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  She states now she has increased pain with shoulder flexion and abduction.  She denies any restrictions in range of motion.  She describes the pain as sharp and constant. It is non-radiating. Has tried extra strength Acetaminophen without relief.  Other associated symptoms: Denies any numbness or tingling of the extremity.  She endorses mild neck pain.  Denies any elbow pain or wrist pain associated with her symptoms.  No periscapular pain.  She has had symptoms like this similarly in her left shoulder.  She has received a subacromial injection in her left shoulder which resolved her symptoms.  She is requesting an injection today of her left shoulder.  She is otherwise healthy.   Review of systems:  As stated above   Interval past medical history, surgical history, family history, and social history obtained and are unchanged. Nonsmoker. 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  Medications reviewed and unchanged. Allergies: No known drug allergies  OBJECTIVE:  Physical exam: Vital signs are reviewed. BP 129/80   Ht 5\' 2"  (1.575 m)   Wt 202 lb (91.6 kg)   BMI 36.95 kg/m   Gen.: Alert, oriented, appears stated age, in no apparent distress Neurologic:  Sensation is intact to light touch C5-T1.  Negative Spurling's test. Back: No midline cervical, thoracic, lumbar spine tenderness. Gait: normal without associated limp Psych: Normal affect, mood is described as good Musculoskeletal: Inspection of the right shoulder demonstrates no acute abnormalities.  She has trace tenderness to palpation in the bicipital groove.  She has restricted  active range of motion to approximately 100 degrees.  Her passive range of motion is full.  She has restricted active abduction to 100 degrees.  Her passive abduction is full.  She has adequate internal and external rotation although uncomfortable with external rotation.  Strength testing is 5 out of 5 in all ranges of motion however painful with resisted motion in forward flexion and abduction.  Positive Neer's test.  Positive Hawkins test.  Discomfort with empty can however no weakness.  Negative crossarm test.  Negative speeds and Yergason's testing.  Negative O'Brien's test.  She is neurovascularly intact.    ASSESSMENT & PLAN: Tendinopathy of rotator cuff, right INJECTION: Patient was given informed consent, signed copy in the chart. Appropriate time out was taken. Area prepped and draped in usual sterile fashion. 1 cc of methylprednisolone 40 mg/ml plus  4 cc of 0.5% marcaine without epinephrine was injected into the right shoulder subacromial injection. The patient tolerated the procedure well. There were no complications. Post procedure instructions were given.  - Rotator cuff exercises given to be completed daily for 4-6 weeks.  I advised that she may continue with anti-inflammatories as needed for pain and inflammation.  She would like to continue taking Tylenol as needed. -If she is not improving, I am recommending an x-ray of her right shoulder to start plus or minus a diagnostic shoulder ultrasound to evaluate the rotator cuff.  I offered to order an x-ray today however she declined.  She would like to try the home exercises and the injection first. -  I did caution her from lifting heavy objects greater than 20 pounds over her head as this may worsen her symptoms.  She is aware. -Follow-up tentatively for 6 weeks unless she is significantly improved.  She was given patient education prior to discharge.    Meds ordered this encounter  Medications  . methylPREDNISolone acetate  (DEPO-MEDROL) injection 40 mg      Gustavus MessingAJ Pinney, DO Sports Medicine Fellow [place]  I was the preceptor for this visit and available for immediate consultation. Marsa Arisimothy Ryan Draper, DO

## 2018-09-05 NOTE — Assessment & Plan Note (Signed)
INJECTION: Patient was given informed consent, signed copy in the chart. Appropriate time out was taken. Area prepped and draped in usual sterile fashion. 1 cc of methylprednisolone 40 mg/ml plus  4 cc of 0.5% marcaine without epinephrine was injected into the right shoulder subacromial injection. The patient tolerated the procedure well. There were no complications. Post procedure instructions were given.  - Rotator cuff exercises given to be completed daily for 4-6 weeks.  I advised that she may continue with anti-inflammatories as needed for pain and inflammation.  She would like to continue taking Tylenol as needed. -If she is not improving, I am recommending an x-ray of her right shoulder to start.  I offered this to her today however she declined.  She would like to try the home exercises and the injection first. -I did caution her from lifting heavy objects greater than 20 pounds over her head as this may worsen her symptoms.  She is aware.

## 2018-10-17 ENCOUNTER — Ambulatory Visit: Payer: BLUE CROSS/BLUE SHIELD | Admitting: Sports Medicine

## 2018-11-16 ENCOUNTER — Other Ambulatory Visit: Payer: Self-pay

## 2018-11-16 ENCOUNTER — Encounter (HOSPITAL_COMMUNITY): Payer: Self-pay

## 2018-11-16 ENCOUNTER — Emergency Department (HOSPITAL_COMMUNITY): Payer: BLUE CROSS/BLUE SHIELD

## 2018-11-16 ENCOUNTER — Emergency Department (HOSPITAL_COMMUNITY)
Admission: EM | Admit: 2018-11-16 | Discharge: 2018-11-16 | Disposition: A | Discharge status: Home / Self Care | Payer: BLUE CROSS/BLUE SHIELD | Attending: Emergency Medicine | Admitting: Emergency Medicine

## 2018-11-16 DIAGNOSIS — Y929 Unspecified place or not applicable: Secondary | ICD-10-CM | POA: Insufficient documentation

## 2018-11-16 DIAGNOSIS — X501XXA Overexertion from prolonged static or awkward postures, initial encounter: Secondary | ICD-10-CM | POA: Insufficient documentation

## 2018-11-16 DIAGNOSIS — M25561 Pain in right knee: Secondary | ICD-10-CM | POA: Insufficient documentation

## 2018-11-16 DIAGNOSIS — Y999 Unspecified external cause status: Secondary | ICD-10-CM | POA: Insufficient documentation

## 2018-11-16 DIAGNOSIS — Z79899 Other long term (current) drug therapy: Secondary | ICD-10-CM | POA: Insufficient documentation

## 2018-11-16 DIAGNOSIS — Y9389 Activity, other specified: Secondary | ICD-10-CM | POA: Insufficient documentation

## 2018-11-16 DIAGNOSIS — R030 Elevated blood-pressure reading, without diagnosis of hypertension: Secondary | ICD-10-CM | POA: Insufficient documentation

## 2018-11-16 MED ORDER — IBUPROFEN 800 MG PO TABS
800.0000 mg | ORAL_TABLET | Freq: Once | ORAL | Status: AC
  Administered 2018-11-16: 800 mg via ORAL
  Filled 2018-11-16: qty 1

## 2018-11-16 MED ORDER — NAPROXEN 500 MG PO TABS: 500.0000 mg | ORAL_TABLET | Freq: Two times a day (BID) | ORAL | 0 refills | Status: DC

## 2018-11-16 NOTE — ED Notes (Signed)
Patient transported to X-ray 

## 2018-11-16 NOTE — ED Triage Notes (Signed)
Pt reports pain to right knee with no trauma.  Pt reports distant hx of knee pain where she received injections into the knee for pain.  No swelling or redness noted compared to left knee.  Pt points to posterior right knee where pain is worse.

## 2018-11-16 NOTE — Discharge Instructions (Signed)
You have been seen today for knee pain. Please read and follow all provided instructions.   1. Medications: naproxen for pain, usual home medications 2. Treatment: rest, drink plenty of fluids 3. Follow Up: Please follow up with orthopedics if symptoms persist. Please follow up with your primary doctor in 2 days for discussion of your diagnoses and further evaluation after today's visit; if you do not have a primary care doctor use the resource guide provided to find one; Please return to the ER for any new or worsening symptoms. Please obtain all of your results from medical records or have your doctors office obtain the results - share them with your doctor - you should be seen at your doctors office. Call today to arrange your follow up.   Take medications as prescribed. Please review all of the medicines and only take them if you do not have an allergy to them. Return to the emergency room for worsening condition or new concerning symptoms. Follow up with your regular doctor. If you don't have a regular doctor use one of the numbers below to establish a primary care doctor.  Please be aware that if you are taking birth control pills, taking other prescriptions, ESPECIALLY ANTIBIOTICS may make the birth control ineffective - if this is the case, either do not engage in sexual activity or use alternative methods of birth control such as condoms until you have finished the medicine and your family doctor says it is OK to restart them. If you are on a blood thinner such as COUMADIN, be aware that any other medicine that you take may cause the coumadin to either work too much, or not enough - you should have your coumadin level rechecked in next 7 days if this is the case.  ?  It is also a possibility that you have an allergic reaction to any of the medicines that you have been prescribed - Everybody reacts differently to medications and while MOST people have no trouble with most medicines, you may have a  reaction such as nausea, vomiting, rash, swelling, shortness of breath. If this is the case, please stop taking the medicine immediately and contact your physician.  ?  You should return to the ER if you develop severe or worsening symptoms.   Emergency Department Resource Guide 1) Find a Doctor and Pay Out of Pocket Although you won't have to find out who is covered by your insurance plan, it is a good idea to ask around and get recommendations. You will then need to call the office and see if the doctor you have chosen will accept you as a new patient and what types of options they offer for patients who are self-pay. Some doctors offer discounts or will set up payment plans for their patients who do not have insurance, but you will need to ask so you aren't surprised when you get to your appointment.  2) Contact Your Local Health Department Not all health departments have doctors that can see patients for sick visits, but many do, so it is worth a call to see if yours does. If you don't know where your local health department is, you can check in your phone book. The CDC also has a tool to help you locate your state's health department, and many state websites also have listings of all of their local health departments.  3) Find a Walk-in Clinic If your illness is not likely to be very severe or complicated, you may want to  try a walk in clinic. These are popping up all over the country in pharmacies, drugstores, and shopping centers. They're usually staffed by nurse practitioners or physician assistants that have been trained to treat common illnesses and complaints. They're usually fairly quick and inexpensive. However, if you have serious medical issues or chronic medical problems, these are probably not your best option.  No Primary Care Doctor: Call Health Connect at  [phone] - they can help you locate a primary care doctor that  accepts your insurance, provides certain services,  etc. Physician Referral [phone]  Emergency Department Resource Guide 1) Find a Doctor and Pay Out of Pocket Although you won't have to find out who is covered by your insurance plan, it is a good idea to ask around and get recommendations. You will then need to call the office and see if the doctor you have chosen will accept you as a new patient and what types of options they offer for patients who are self-pay. Some doctors offer discounts or will set up payment plans for their patients who do not have insurance, but you will need to ask so you aren't surprised when you get to your appointment.  2) Contact Your Local Health Department Not all health departments have doctors that can see patients for sick visits, but many do, so it is worth a call to see if yours does. If you don't know where your local health department is, you can check in your phone book. The CDC also has a tool to help you locate your state's health department, and many state websites also have listings of all of their local health departments.  3) Find a [facility] If your illness is not likely to be very severe or complicated, you may want to try a walk in clinic. These are popping up all over the country in pharmacies, drugstores, and shopping centers. They're usually staffed by nurse practitioners or physician assistants that have been trained to treat common illnesses and complaints. They're usually fairly quick and inexpensive. However, if you have serious medical issues or chronic medical problems, these are probably not your best option.  No Primary Care Doctor: Call Health Connect at  [phone] - they can help you locate a primary care doctor that  accepts your insurance, provides certain services, etc. Physician Referral Service- [phone]  Chronic Pain Problems: Organization         Address  Phone   Notes  [facility]  [phone] Patients need to be referred by their  primary care doctor.   Medication Assistance: Organization         Address  Phone   Notes  [facility] Medication [facility] [address], [place] [address] [phone] --Must be a resident of [facility] -- Must have NO insurance coverage whatsoever (no Medicaid/ Medicare, etc.) -- The pt. MUST have a primary care doctor that directs their care regularly and follows them in the community   [facility]  [phone]   [employer]  [phone]    Agencies that provide inexpensive medical care: Organization         Address  Phone   Notes  [place]  [phone]   [facility]    [phone]   [facility] [place], [place] [address] [phone]   [place] [address] [place] [phone]  Planned Parenthood    [phone]   [facility]    [phone]   [facility] [address], [place] Phone:  [phone], Fax:  [phone] Hours of Operation:  9 am - 6 pm, M-F.  Also accepts Medicaid/Medicare and self-pay.  [facility] for [place] [address], [address], [place] Phone: [phone], Fax: [phone]. Hours of Operation:  8:30 am - 5:30 pm, M-F.  Also accepts Medicaid and self-pay.  [facility] [place] [address], [place] Phone: [phone]   [place], [place], [place] [phone], [address]. [phone] Mondays & Thursdays: 7-9 AM.  First 15 patients are seen on a first come, first serve basis.    [place] Providers:  Organization         Address  Phone   Notes  [facility] [address],  [phone] Also accepts self-pay patients.  [facility] [address], [place]  [phone]   [place], [address], [place] [phone]   [facility] [address] [place] [phone]   Lucianne Lei [address], [place]   [phone] Only accepts [place] [facility] [place] patients after they have their name applied to their card.   Self-Pay (no insurance) in [facility]:  Organization         Address  Phone   Notes  Sickle Cell Patients, [facility] [place] [phone]   [facility] [place] [phone]   [facility] [place]  [place], [place], [place] [phone]   [facility]/Dr. Osei-Bonsu  [address] [place] or [address], [place] [phone] Phone number for both [place] and [place] locations is the same.  Urgent Medical and [facility] [address], [place] [phone]   [facility] [address] [place] or [address] [phone]   [facility] [address], [place] [phone], phone; [phone], fax Sees patients 1st and 3rd Saturday of every month.  Must not qualify for public or private insurance (i.e. Medicaid, Medicare, [place] [employer] Choice, Veterans' Benefits)  Household income should be no more than 200% of the poverty level The clinic cannot treat you if you are pregnant or think you are pregnant  Sexually transmitted diseases are not treated at the clinic.

## 2018-11-16 NOTE — ED Provider Notes (Signed)
[facility] EMERGENCY DEPARTMENT Provider Note   CSN: [id number] Arrival date & time: 11/16/18  1313     History   Chief Complaint Chief Complaint  Patient presents with  . Knee Pain    HPI [name] is a 53 y.o. female with a PMH of GERD, knee pain, osteoarthritis, and low back pain presenting with right posterior knee pain onset this morning after bending her knees to pray. Son is a contributing historian. Patient describes pain as a constant and sharp. Patient states she has had knee injections in the past with partial relief. Patient reports she has difficulty ambulating due to pain. Patient denies edema, erythema, or skin changes. Patient denies taking any medicine. Patient denies fever, numbness, paresthesias, or weakness. Patient denies nausea, vomiting, or abdominal pain. Patient denies an injury or pop sensation.   HPI  Past Medical History:  Diagnosis Date  . GERD (gastroesophageal reflux disease)   . Knee pain   . Lower back pain     Patient Active Problem List   Diagnosis Date Noted  . Greater trochanteric bursitis of left hip 01/24/2018  . Right shoulder pain 07/24/2016  . Atypical chest pain 06/17/2016  . Osteoarthritis of right knee 04/21/2016  . Left shoulder pain 05/15/2014  . Low back pain 12/25/2013  . Right hip pain 11/29/2013  . Headache(784.0) 01/22/2013  . Hyperlipidemia LDL goal < 160 07/12/2012  . Tendinopathy of rotator cuff, right 06/26/2012  . Language barrier 12/08/2011  . Ganglion cyst 12/08/2011  . Obesity (BMI 30.0-34.9) 12/08/2011    Past Surgical History:  Procedure Laterality Date  . DIRECT LARYNGOSCOPY N/A 07/12/2018   Procedure: DIRECT LARYNGOSCOPY;  Surgeon: Newman Pies, MD;  Location: [facility] OR;  Service: ENT;  Laterality: N/A;  . knee injections    . shoulder injections       OB History   No obstetric history on file.      Home Medications    Prior to Admission medications   Medication Sig Start Date  End Date Taking? Authorizing Provider  diclofenac (VOLTAREN) 75 MG EC tablet  11/21/16   [provider]  Multiple Vitamin (MULTIVITAMIN) tablet Take 1 tablet by mouth daily. Doesn't take daily    [provider]  naproxen (NAPROSYN) 500 MG tablet Take 1 tablet (500 mg total) by mouth 2 (two) times daily. 11/16/18   Carlyle Basques P, PA-C  pantoprazole (PROTONIX) 40 MG tablet Take 40 mg by mouth daily.    [provider]  predniSONE (DELTASONE) 20 MG tablet 3 tabs po day one, then 2 tabs daily x 4 days Patient not taking: Reported on 09/05/2018 05/28/18   Rolan Bucco, MD  tiZANidine (ZANAFLEX) 4 MG tablet  11/21/16   [provider]  traMADol (ULTRAM) 50 MG tablet Take 1 tablet (50 mg total) by mouth every 6 (six) hours as needed. 05/28/18   Rolan Bucco, MD    Family History Family History  Problem Relation Age of Onset  . Sudden death Neg Hx   . Hyperlipidemia Neg Hx   . Heart attack Neg Hx   . Diabetes Neg Hx   . Hypertension Neg Hx   . Heart disease Neg Hx   . Colon cancer Neg Hx   . Colon polyps Neg Hx   . Esophageal cancer Neg Hx   . Rectal cancer Neg Hx   . Stomach cancer Neg Hx     Social History Social History   Tobacco Use  . Smoking  status: Never Smoker  . Smokeless tobacco: Never Used  Substance Use Topics  . Alcohol use: No  . Drug use: No     Allergies   Patient has no known allergies.   Review of Systems Review of Systems  Constitutional: Negative for chills, diaphoresis and fever.  Respiratory: Negative for cough and shortness of breath.   Cardiovascular: Negative for leg swelling.  Gastrointestinal: Negative for abdominal pain, nausea and vomiting.  Endocrine: Negative for cold intolerance and heat intolerance.  Musculoskeletal: Positive for arthralgias and gait problem. Negative for back pain, joint swelling and myalgias.  Skin: Negative for rash.  Allergic/Immunologic: Negative for immunocompromised state.    Neurological: Negative for weakness and numbness.  Hematological: Negative for adenopathy.     Physical Exam Updated Vital Signs BP (!) 151/99 (BP Location: Right Arm)   Pulse 67   Temp 98.4 F (36.9 C) (Oral)   Resp 16   Ht 5\' 5"  (1.651 m)   Wt 90.7 kg   SpO2 99%   BMI 33.28 kg/m   Physical Exam Vitals signs and nursing note reviewed.  Constitutional:      General: She is not in acute distress.    Appearance: She is well-developed. She is not diaphoretic.  HENT:     Head: Normocephalic and atraumatic.  Cardiovascular:     Rate and Rhythm: Normal rate and regular rhythm.     Heart sounds: Normal heart sounds. No murmur. No friction rub. No gallop.   Pulmonary:     Effort: Pulmonary effort is normal. No respiratory distress.     Breath sounds: Normal breath sounds. No wheezing or rales.  Abdominal:     Palpations: Abdomen is soft.     Tenderness: There is no abdominal tenderness.  Musculoskeletal:        General: No swelling or deformity.     Right hip: Normal. She exhibits normal range of motion, normal strength and no tenderness.     Right knee: She exhibits decreased range of motion. She exhibits no swelling, no effusion, no ecchymosis, no deformity, no laceration, no erythema, normal alignment, no LCL laxity, normal patellar mobility, no bony tenderness, normal meniscus and no MCL laxity. Tenderness found. No medial joint line, no lateral joint line, no MCL, no LCL and no patellar tendon tenderness noted.     Left knee: Normal. She exhibits normal range of motion, no swelling and no effusion.     Right ankle: Normal. She exhibits normal range of motion, no swelling and no ecchymosis.     Left ankle: Normal. She exhibits normal range of motion, no swelling and no ecchymosis.     Comments: No skin changes noted on knees bilaterally. Right posterior knee pain upon palpation. Decreased ROM due to pain. Decreased strength due to pain. Sensation intact. 2+ DP pulses. Negative  anterior and posterior drawer test. Negative varus and valgus stress test. Difficulty with ambulation due to pain.   Skin:    Findings: No erythema or rash.  Neurological:     Mental Status: She is alert and oriented to person, place, and time.      ED Treatments / Results  Labs (all labs ordered are listed, but only abnormal results are displayed) Labs Reviewed - No data to display  EKG None  Radiology Dg Knee 2 Views Right  Result Date: 11/16/2018 CLINICAL DATA:  Pain in the medial popliteal area this morning. Unable to bear weight. EXAM: RIGHT KNEE - 1-2 VIEW COMPARISON:  Radiographs 10/08/2014  FINDINGS: The mineralization and alignment are normal. There is no evidence of acute fracture or dislocation. There are tricompartmental degenerative changes which are prominent for age, similar to the previous study. No significant joint effusion. IMPRESSION: No acute osseous findings. Stable tricompartmental degenerative changes, prominent for age. Electronically Signed   By: Carey Bullocks M.D.   On: 11/16/2018 15:23    Procedures Procedures (including critical care time)  Medications Ordered in ED Medications  ibuprofen (ADVIL,MOTRIN) tablet 800 mg (800 mg Oral Given 11/16/18 1404)     Initial Impression / Assessment and Plan / ED Course  I have reviewed the triage vital signs and the nursing notes.  Pertinent labs & imaging results that were available during my care of the patient were reviewed by me and considered in my medical decision making (see chart for details).  Clinical Course as of Nov 16 1532  Fri Nov 16, 2018  1526 No acute abnormality noted on knee x ray.  DG Knee 2 Views Right [AH]    Clinical Course User Index [AH] Leretha Dykes, PA-C   Patient presents with knee pain. Patient X-Ray negative for obvious fracture or dislocation. Pain managed in ED. Pt advised to follow up with orthopedics if symptoms persist for possibility of missed fracture diagnosis.  Patient given brace while in ED, conservative therapy recommended and discussed. Prescribed naproxen for pain. Discussed elevated BP with patient. Patient denies any symptoms. Advised patient to follow up with PCP regarding BP control. Patient will be dc home & is agreeable with above plan.   Final Clinical Impressions(s) / ED Diagnoses   Final diagnoses:  Acute pain of right knee    ED Discharge Orders         Ordered    naproxen (NAPROSYN) 500 MG tablet  2 times daily     11/16/18 1533           Carlyle Basques [place], [place] 11/16/18 1534    Pricilla Loveless, MD 11/17/18 1505

## 2018-11-21 ENCOUNTER — Ambulatory Visit (INDEPENDENT_AMBULATORY_CARE_PROVIDER_SITE_OTHER): Payer: BLUE CROSS/BLUE SHIELD | Admitting: Sports Medicine

## 2018-11-21 ENCOUNTER — Encounter: Payer: Self-pay | Admitting: Sports Medicine

## 2018-11-21 VITALS — BP 126/86 | Ht 62.0 in | Wt 202.0 lb

## 2018-11-21 DIAGNOSIS — M1711 Unilateral primary osteoarthritis, right knee: Secondary | ICD-10-CM | POA: Diagnosis not present

## 2018-11-21 MED ORDER — METHYLPREDNISOLONE ACETATE 40 MG/ML IJ SUSP
40.0000 mg | Freq: Once | INTRAMUSCULAR | Status: AC
  Administered 2018-11-21: 40 mg via INTRA_ARTICULAR

## 2018-11-21 NOTE — Patient Instructions (Signed)
It is great to see you today for your office visit.  We performed a steroid injection.  Please give this minimum 1 week to see its efficacy.  If you continue to be in pain, we will discuss further options including: Advanced imaging versus Visco supplementation versus referral to orthopedics.  You may wear your brace as needed.

## 2018-11-21 NOTE — Assessment & Plan Note (Signed)
-  Reviewed x-rays from the emergency department.  It demonstrates moderate tricompartmental arthritis without any evident fractures or dislocations. INJECTION: Patient was given informed consent, signed copy in the chart. Appropriate time out was taken. Area prepped and draped in usual sterile fashion. 1 cc of methylprednisolone 40 mg/ml plus  2 cc of 1% lidocaine without epinephrine was injected into the R intra-articular space of the knee using a(n) superolateral approach. One attempt was made in a seated position from an anterolateral approach however the patient was constantly flexing the knee and this approach was aborted. There were no complications of the procedure. Post procedure instructions were given.  - She was fit with a hinged knee brace to wear as needed for assisted mobility. - Counseled on activities like kneeling, squatting, and getting up from a seated position might be bothersome and to modify her activities according. - May continue PRN Diclofenac for pain. Take her PPI if she is taking this for GI protection. - F/u in 4-6 weeks if still painful. Discussed possibility of viscosupplementation at that time vs Ortho referral vs advanced imaging if still in pain.

## 2018-11-21 NOTE — Progress Notes (Signed)
[name] - 53 y.o. female MRN [id number]  Date of birth: 09-08-1966   Chief complaint: R knee pain  SUBJECTIVE:    History of present illness: 53 year old female that presents today with chief complaint of right anterior knee pain.  She states last week, she was kneeling down to a praying position when she felt a stretch sudden pain in her anterior aspect of her right knee.  Pain initially was sharp and rated 9 out of 10.  She ended up going to the emergency department and had x-rays performed.  These x-rays demonstrated stable moderate tricompartmental arthritis of her right knee.  She was given Voltaren oral anti-inflammatories as well as a PPI for GI protection.  She states she has been taking these however she continues to have pain.  Her pain today has improved however to 5 out of 10 and focal.  She denies any locking or popping of her knee but does state at times it feels unstable.  Positions like kneeling and getting up from a seated position exacerbate her pain.  Relative rest, ice, and anti-inflammatories do improve her situation.  She also is here approximately 1 year ago and received a knee injection which provided her significant benefit.  She is here today for consideration of repeat injection.  Denies any hip, groin, or ankle pain today.  No other complaints.   Review of systems:  As stated above   Interval past medical history, surgical history, family history, and social history obtained and are unchanged.   Medications reviewed and unchanged. Allergies reviewed and unchanged.  OBJECTIVE:  Physical exam: Vital signs are reviewed. BP 126/86   Ht 5\' 2"  (1.575 m)   Wt 202 lb (91.6 kg)   BMI 36.95 kg/m   Gen.: Alert, oriented, appears stated age, in no apparent distress, interpreter for [place] is present Integumentary: No rashes or skin lesions Neurologic: sensation intact to light touch L4-S1 Gait: normal without associated limp Musculoskeletal: Inspection of the right  knee demonstrates no obvious swelling or deformity.  She has mild medial joint line tenderness palpation.  Severe patellofemoral crepitus with range of motion.  No palpable Baker's cyst today.  Full range of motion knee flexion extension.  Strength testing 5 out of 5 in knee flexion extension.  Negative anterior posterior drawer.  Negative Lockman test.  Negative McMurray's testing.  Positive patellar grind.  Stable to varus valgus stress.  Unable to perform [facility] secondary to feeling of instability.  She is neurovascularly intact.    ASSESSMENT & PLAN: Osteoarthritis of right knee -Reviewed x-rays from the emergency department.  It demonstrates moderate tricompartmental arthritis without any evident fractures or dislocations. INJECTION: Patient was given informed consent, signed copy in the chart. Appropriate time out was taken. Area prepped and draped in usual sterile fashion. 1 cc of methylprednisolone 40 mg/ml plus  2 cc of 1% lidocaine without epinephrine was injected into the R intra-articular space of the knee using a(n) superolateral approach. One attempt was made in a seated position from an anterolateral approach however the patient was constantly flexing the knee and this approach was aborted. There were no complications of the procedure. Post procedure instructions were given.  - She was fit with a hinged knee brace to wear as needed for assisted mobility. - Counseled on activities like kneeling, squatting, and getting up from a seated position might be bothersome and to modify her activities according. - May continue PRN Diclofenac for pain. Take her PPI if she is taking this  for GI protection. - F/u in 4-6 weeks if still painful. Discussed possibility of viscosupplementation at that time vs Ortho referral vs advanced imaging if still in pain.    Gustavus Messing, DO Sports Medicine Fellow [place]  I was the preceptor for this visit and available for immediate consultation Marsa Aris, DO

## 2018-11-25 ENCOUNTER — Encounter (HOSPITAL_COMMUNITY): Payer: Self-pay | Admitting: Emergency Medicine

## 2018-11-25 ENCOUNTER — Emergency Department (HOSPITAL_COMMUNITY)
Admission: EM | Admit: 2018-11-25 | Discharge: 2018-11-25 | Disposition: A | Discharge status: Home / Self Care | Payer: BLUE CROSS/BLUE SHIELD | Attending: Emergency Medicine | Admitting: Emergency Medicine

## 2018-11-25 ENCOUNTER — Other Ambulatory Visit: Payer: Self-pay

## 2018-11-25 DIAGNOSIS — J020 Streptococcal pharyngitis: Secondary | ICD-10-CM | POA: Insufficient documentation

## 2018-11-25 DIAGNOSIS — Z79899 Other long term (current) drug therapy: Secondary | ICD-10-CM | POA: Insufficient documentation

## 2018-11-25 DIAGNOSIS — R070 Pain in throat: Secondary | ICD-10-CM | POA: Diagnosis present

## 2018-11-25 LAB — BASIC METABOLIC PANEL
Anion gap: 6 (ref 5–15)
BUN: 7 mg/dL (ref 6–20)
CALCIUM: 8.8 mg/dL — AB (ref 8.9–10.3)
CHLORIDE: 103 mmol/L (ref 98–111)
CO2: 27 mmol/L (ref 22–32)
CREATININE: 0.82 mg/dL (ref 0.44–1.00)
Glucose, Bld: 115 mg/dL — ABNORMAL HIGH (ref 70–99)
Potassium: 3.5 mmol/L (ref 3.5–5.1)
SODIUM: 136 mmol/L (ref 135–145)

## 2018-11-25 LAB — INFLUENZA PANEL BY PCR (TYPE A & B)
Influenza A By PCR: NEGATIVE
Influenza B By PCR: NEGATIVE

## 2018-11-25 LAB — CBC WITH DIFFERENTIAL/PLATELET
Abs Immature Granulocytes: 0.04 10*3/uL (ref 0.00–0.07)
BASOS ABS: 0 10*3/uL (ref 0.0–0.1)
BASOS PCT: 0 %
Eosinophils Absolute: 0 10*3/uL (ref 0.0–0.5)
Eosinophils Relative: 0 %
HCT: 37 % (ref 36.0–46.0)
Hemoglobin: 12.1 g/dL (ref 12.0–15.0)
Immature Granulocytes: 0 %
Lymphocytes Relative: 12 %
Lymphs Abs: 1.4 10*3/uL (ref 0.7–4.0)
MCH: 28.8 pg (ref 26.0–34.0)
MCHC: 32.7 g/dL (ref 30.0–36.0)
MCV: 88.1 fL (ref 80.0–100.0)
Monocytes Absolute: 1 10*3/uL (ref 0.1–1.0)
Monocytes Relative: 9 %
NRBC: 0 % (ref 0.0–0.2)
Neutro Abs: 8.8 10*3/uL — ABNORMAL HIGH (ref 1.7–7.7)
Neutrophils Relative %: 79 %
PLATELETS: 253 10*3/uL (ref 150–400)
RBC: 4.2 MIL/uL (ref 3.87–5.11)
RDW: 12.5 % (ref 11.5–15.5)
WBC: 11.3 10*3/uL — AB (ref 4.0–10.5)

## 2018-11-25 LAB — GROUP A STREP BY PCR: GROUP A STREP BY PCR: DETECTED — AB

## 2018-11-25 LAB — LACTIC ACID, PLASMA: Lactic Acid, Venous: 1.2 mmol/L (ref 0.5–1.9)

## 2018-11-25 MED ORDER — PENICILLIN G BENZATHINE 1200000 UNIT/2ML IM SUSP
1.2000 10*6.[IU] | Freq: Once | INTRAMUSCULAR | Status: AC
  Administered 2018-11-25: 1.2 10*6.[IU] via INTRAMUSCULAR
  Filled 2018-11-25: qty 2

## 2018-11-25 MED ORDER — ACETAMINOPHEN 500 MG PO TABS
1000.0000 mg | ORAL_TABLET | Freq: Once | ORAL | Status: AC
  Administered 2018-11-25: 1000 mg via ORAL
  Filled 2018-11-25: qty 2

## 2018-11-25 MED ORDER — SODIUM CHLORIDE 0.9 % IV BOLUS
1000.0000 mL | Freq: Once | INTRAVENOUS | Status: AC
  Administered 2018-11-25: 1000 mL via INTRAVENOUS

## 2018-11-25 NOTE — ED Triage Notes (Signed)
Onset 2 days developed a headache and 1 days ago sore throat with body achy. Airway intact.

## 2018-11-25 NOTE — ED Provider Notes (Signed)
[facility] EMERGENCY DEPARTMENT Provider Note   CSN: [id number] Arrival date & time: 11/25/18  4098  History   Chief Complaint Chief Complaint  Patient presents with  . Headache  . Generalized Body Aches  . Sore Throat    HPI [name] is a 53 y.o. female with past medical history who presents for evaluation of flulike symptoms.  Patient states she has had a headache, sore throat, body aches and pains, cough, rhinorrhea x2 days.  Did not receive flu vaccine.  Patient states she has had nausea with one episode of emesis.  Emesis nonbloody, nonbilious in nature.  Denies vision changes, facial asymmetry, unilateral weakness, neck pain, neck stiffness, chest pain, shortness of breath, abdominal pain, diarrhea dysuria.  Took Motrin yesterday at 6 PM without relief of symptoms.  Has not taken any additional medications.  Denies known sick contacts.  Has had decreased appetite for solid foods, however is been able to tolerate p.o. liquids without difficulty.  History obtained from patient and family.  Patient refused medical interpretor at this time.     HPI  Past Medical History:  Diagnosis Date  . GERD (gastroesophageal reflux disease)   . Knee pain   . Lower back pain     Patient Active Problem List   Diagnosis Date Noted  . Greater trochanteric bursitis of left hip 01/24/2018  . Right shoulder pain 07/24/2016  . Atypical chest pain 06/17/2016  . Osteoarthritis of right knee 04/21/2016  . Left shoulder pain 05/15/2014  . Low back pain 12/25/2013  . Right hip pain 11/29/2013  . Headache(784.0) 01/22/2013  . Hyperlipidemia LDL goal < 160 07/12/2012  . Tendinopathy of rotator cuff, right 06/26/2012  . Language barrier 12/08/2011  . Ganglion cyst 12/08/2011  . Obesity (BMI 30.0-34.9) 12/08/2011    Past Surgical History:  Procedure Laterality Date  . DIRECT LARYNGOSCOPY N/A 07/12/2018   Procedure: DIRECT LARYNGOSCOPY;  Surgeon: Newman Pies, MD;  Location:  [facility] OR;  Service: ENT;  Laterality: N/A;  . knee injections    . shoulder injections       OB History   No obstetric history on file.      Home Medications    Prior to Admission medications   Medication Sig Start Date End Date Taking? Authorizing Provider  diclofenac (VOLTAREN) 75 MG EC tablet  11/21/16   [provider]  Multiple Vitamin (MULTIVITAMIN) tablet Take 1 tablet by mouth daily. Doesn't take daily    [provider]  naproxen (NAPROSYN) 500 MG tablet Take 1 tablet (500 mg total) by mouth 2 (two) times daily. Patient not taking: Reported on 11/21/2018 11/16/18   Carlyle Basques P, PA-C  pantoprazole (PROTONIX) 40 MG tablet Take 40 mg by mouth daily.    [provider]  predniSONE (DELTASONE) 20 MG tablet 3 tabs po day one, then 2 tabs daily x 4 days Patient not taking: Reported on 09/05/2018 05/28/18   Rolan Bucco, MD  tiZANidine (ZANAFLEX) 4 MG tablet  11/21/16   [provider]  traMADol (ULTRAM) 50 MG tablet Take 1 tablet (50 mg total) by mouth every 6 (six) hours as needed. Patient not taking: Reported on 11/21/2018 05/28/18   Rolan Bucco, MD    Family History Family History  Problem Relation Age of Onset  . Sudden death Neg Hx   . Hyperlipidemia Neg Hx   . Heart attack Neg Hx   . Diabetes Neg Hx   . Hypertension Neg Hx   .  Heart disease Neg Hx   . Colon cancer Neg Hx   . Colon polyps Neg Hx   . Esophageal cancer Neg Hx   . Rectal cancer Neg Hx   . Stomach cancer Neg Hx     Social History Social History   Tobacco Use  . Smoking status: Never Smoker  . Smokeless tobacco: Never Used  Substance Use Topics  . Alcohol use: No  . Drug use: No     Allergies   Patient has no known allergies.   Review of Systems Review of Systems  Constitutional: Positive for appetite change, chills and fever.  HENT: Positive for congestion, postnasal drip, rhinorrhea and sore throat. Negative for ear discharge, ear pain, facial  swelling, sinus pressure, sinus pain, sneezing, tinnitus, trouble swallowing and voice change.   Respiratory: Positive for cough. Negative for apnea, choking, chest tightness, shortness of breath, wheezing and stridor.   Cardiovascular: Negative.   Gastrointestinal: Positive for nausea and vomiting. Negative for abdominal distention, abdominal pain, anal bleeding, blood in stool, constipation, diarrhea and rectal pain.  Genitourinary: Negative.   Musculoskeletal: Negative.   Skin: Negative.   Neurological: Negative.   All other systems reviewed and are negative.    Physical Exam Updated Vital Signs BP 121/84 (BP Location: Right Arm)   Pulse 92   Temp 100.2 F (37.9 C) (Oral)   Resp 16   Ht  (1.575 m)   Wt 90.7 kg   SpO2 99%   BMI 36.58 kg/m   Physical Exam Vitals signs and nursing note reviewed.  Constitutional:      General: She is not in acute distress.    Appearance: She is well-developed. She is not toxic-appearing or diaphoretic.     Comments: Appears sick.  HENT:     Head: Normocephalic and atraumatic.     Right Ear: Tympanic membrane, ear canal and external ear normal. There is no impacted cerumen.     Left Ear: Tympanic membrane, ear canal and external ear normal. There is no impacted cerumen.     Nose:     Comments: Clear rhinorrhea to bilateral nares.    Mouth/Throat:     Comments: Oropharynx erythematous without exudate.  Tonsils 1+ bilaterally without exudate.  Mucous membranes moist.  No abnormal elevation of palate.  No drooling, dysphasia or trismus. Eyes:     Pupils: Pupils are equal, round, and reactive to light.     Comments: No horizontal, vertical or rotational nystagmus   Neck:     Musculoskeletal: Normal range of motion.     Comments: No neck stiffness or neck rigidity.  No meningismus. Full active and passive ROM without pain No midline or paraspinal tenderness No nuchal rigidity or meningeal signs  Cardiovascular:     Rate and Rhythm:  Tachycardia present.     Pulses: Normal pulses.     Heart sounds: Normal heart sounds.  Pulmonary:     Effort: No respiratory distress.     Comments: Clear to auscultation bilateral without wheeze, rhonchi or rales.  No accessory muscle usage.  Able to speak in full sentences without difficulty. Abdominal:     General: There is no distension.     Comments: Soft, Nontender without rebound or guarding.  Musculoskeletal: Normal range of motion.  Skin:    General: Skin is warm and dry.  Neurological:     Mental Status: She is alert.     Comments: Mental Status:  Alert, oriented, thought content appropriate. Speech fluent without  evidence of aphasia. Able to follow 2 step commands without difficulty.  Cranial Nerves:  II:  Peripheral visual fields grossly normal, pupils equal, round, reactive to light III,IV, VI: ptosis not present, extra-ocular motions intact bilaterally  V,VII: smile symmetric, facial light touch sensation equal VIII: hearing grossly normal bilaterally  IX,X: midline uvula rise  XI: bilateral shoulder shrug equal and strong XII: midline tongue extension  Motor:  5/5 in upper and lower extremities bilaterally including strong and equal grip strength and dorsiflexion/plantar flexion Sensory: Pinprick and light touch normal in all extremities.  Deep Tendon Reflexes: 2+ and symmetric  Cerebellar: normal finger-to-nose with bilateral upper extremities Gait: normal gait and balance CV: distal pulses palpable throughout      ED Treatments / Results  Labs (all labs ordered are listed, but only abnormal results are displayed) Labs Reviewed  GROUP A STREP BY PCR - Abnormal; Notable for the following components:      Result Value   Group A Strep by PCR DETECTED (*)    All other components within normal limits  CBC WITH DIFFERENTIAL/PLATELET - Abnormal; Notable for the following components:   WBC 11.3 (*)    Neutro Abs 8.8 (*)    All other components within normal limits   BASIC METABOLIC PANEL - Abnormal; Notable for the following components:   Glucose, Bld 115 (*)    Calcium 8.8 (*)    All other components within normal limits  INFLUENZA PANEL BY PCR (TYPE A & B)  LACTIC ACID, PLASMA    EKG None  Radiology No results found.  Procedures Procedures (including critical care time)  Medications Ordered in ED Medications  acetaminophen (TYLENOL) tablet 1,000 mg (1,000 mg Oral Given 11/25/18 1135)  sodium chloride 0.9 % bolus 1,000 mL (0 mLs Intravenous Stopped 11/25/18 1243)  penicillin g benzathine (BICILLIN LA) 1200000 UNIT/2ML injection 1.2 Million Units (1.2 Million Units Intramuscular Given 11/25/18 1158)   Initial Impression / Assessment and Plan / ED Course  I have reviewed the triage vital signs and the nursing notes.  Pertinent labs & imaging results that were available during my care of the patient were reviewed by me and considered in my medical decision making (see chart for details).  53 year old female appears sick presents for evaluation of flulike symptoms.  Febrile and tachycardic in department.  Also mildly hypertensive.  Posterior oropharynx erythematous tonsils 1+ bilaterally without exudate.  Evidence of PTA RPA.  Uvula midline.  No drooling or dysphasia.  Patient able to tolerate p.o. intake in department without difficulty.  Lungs clear to auscultation bilateral without wheeze, rhonchi or rales.  No hypoxia or tachypnea, low suspicion for pneumonia.  No neck stiffness or neck rigidity.  No meningismus.  Patient also with headache, normal neurologic exam without neurologic deficits.  HA likely related to illness. HA non concerning for [facility], [facility], Meningitis, or temporal arteritis. Will obtain labs give fluids and reevaluate.  1140: Leukocytosis at 11.3, lactic acid 1.2, flu negative, metabolic panel without electrolyte, renal or liver abnormality, strep positive.  Patient was given Tylenol as well as IV fluids.  We will plan for IM  penicillin and reevaluation of vital signs.  1300: Improvement in tachycardia and fever with fluids and Tylenol.  Patient states she feels improvement at this time. Hemodynamically stable and appropriate for discharge at this time.  I have discussed return precautions.  Patient to follow-up with PCP if she continues to have symptoms about 3 to 4 days.  Patient voiced understanding of  return precautions and is agreeable to follow-up.     Final Clinical Impressions(s) / ED Diagnoses   Final diagnoses:  Strep pharyngitis    ED Discharge Orders    None       Dyllen Menning A, PA-C 11/25/18 1336    Cathren Laine, MD 11/25/18 [phone]

## 2018-11-25 NOTE — ED Notes (Signed)
ED Provider at bedside. 

## 2018-11-25 NOTE — Discharge Instructions (Addendum)
Your evaluated today for headache, fever and sore throat.  Your laboratory work was negative, however you do have evidence of strep pharyngitis.  You were given an antibiotic shot in the department.  I would continue take Tylenol and ibuprofen as needed for fever and pain.  I would also suggest taking small sips of liquids so you do not become dehydrated.  Follow-up with PCP for reevaluation if you continue to have symptoms.  Return to the ED for any worsening symptoms.

## 2019-01-02 ENCOUNTER — Telehealth: Payer: BLUE CROSS/BLUE SHIELD | Admitting: Sports Medicine

## 2019-01-02 ENCOUNTER — Ambulatory Visit: Payer: BLUE CROSS/BLUE SHIELD | Admitting: Sports Medicine

## 2019-01-02 ENCOUNTER — Other Ambulatory Visit: Payer: Self-pay

## 2019-02-20 ENCOUNTER — Encounter

## 2019-03-18 ENCOUNTER — Other Ambulatory Visit: Payer: Self-pay

## 2019-03-18 ENCOUNTER — Ambulatory Visit (INDEPENDENT_AMBULATORY_CARE_PROVIDER_SITE_OTHER): Payer: BLUE CROSS/BLUE SHIELD | Admitting: Sports Medicine

## 2019-03-18 ENCOUNTER — Ambulatory Visit
Admission: RE | Admit: 2019-03-18 | Discharge: 2019-03-18 | Disposition: A | Discharge status: Home / Self Care | Payer: BLUE CROSS/BLUE SHIELD | Source: Ambulatory Visit | Attending: Sports Medicine | Admitting: Sports Medicine

## 2019-03-18 VITALS — BP 132/86 | Ht 62.0 in | Wt 202.0 lb

## 2019-03-18 DIAGNOSIS — G8929 Other chronic pain: Secondary | ICD-10-CM | POA: Diagnosis not present

## 2019-03-18 DIAGNOSIS — M1711 Unilateral primary osteoarthritis, right knee: Secondary | ICD-10-CM | POA: Diagnosis not present

## 2019-03-18 DIAGNOSIS — M25512 Pain in left shoulder: Secondary | ICD-10-CM

## 2019-03-18 MED ORDER — METHYLPREDNISOLONE ACETATE 40 MG/ML IJ SUSP
40.0000 mg | Freq: Once | INTRAMUSCULAR | Status: AC
  Administered 2019-03-18: 40 mg via INTRA_ARTICULAR

## 2019-03-18 NOTE — Progress Notes (Signed)
Frazier Richards from [employer] was the interpretor for today.

## 2019-03-18 NOTE — Patient Instructions (Signed)
Follow up with me in 3 weeks and we will go over your x-ray results.

## 2019-03-19 ENCOUNTER — Encounter: Payer: Self-pay | Admitting: Sports Medicine

## 2019-03-19 NOTE — Progress Notes (Signed)
   Subjective:    Patient ID: [name], female    DOB: April 09, 1966, 53 y.o.   MRN: [id number]  HPI chief complaint: Left shoulder and right knee pain  Patient comes in today with a couple of different complaints.  Primary complaint is left shoulder pain which is been present for a week.  No trauma.  She has had problems with the shoulder in the past which responded well to subacromial cortisone injections.  Pain is identical to what she experienced at that time.  She would like to try a repeat injection today. She also continues to endorse right knee pain.  She was last seen in the office on February 19.  Cortisone injection was administered at that time.  This did provide her with symptom relief.  Pain has now returned.  X-rays done prior to that visit were non-standing x-rays of the right knee.  Enter medical history reviewed Medications reviewed Allergies reviewed    Review of Systems As above    Objective:   Physical Exam  Well-developed, well-nourished.  No acute distress.  Awake alert and oriented x3.  Vital signs reviewed.  Left shoulder: Patient has full shoulder range of motion with a positive painful arc.  No tenderness to palpation.  Positive empty can, positive Hawkins.  She does have rotator cuff weakness with testing but this is likely secondary to pain.  Neurovascularly intact distally.  Right knee: Full range of motion.  No obvious effusion.  3+ patellofemoral crepitus.  She is tender to palpation along medial and lateral joint lines.  Negative McMurray's.  Good joint stability.  Neurovascularly intact distally.  Standing x-rays of the right knee including AP, lateral, and a sunrise view are reviewed.  She has moderate degenerative changes in the medial compartment.  Mild changes in the lateral compartment and patellofemoral joint.  Nothing acute.      Assessment & Plan:   Left shoulder pain likely secondary to rotator cuff impingement Right knee pain secondary to  medial compartmental DJD  Patient's left shoulder was injected today with cortisone.  She tolerated this without difficulty.  Return to the office in 3 weeks for reevaluation.  I may consider repeat cortisone injection into the right knee at that time prior to ordering further diagnostic imaging.  This note was dictated using Dragon naturally speaking software and may contain errors in syntax, spelling, or content which have not been identified prior to signing this note.  Consent obtained and verified. Time-out conducted. Noted no overlying erythema, induration, or other signs of local infection. Skin prepped in a sterile fashion. Topical analgesic spray: Ethyl chloride. Joint: left shoulder (subacromial) Needle: 25g 1.5 inch Completed without difficulty. Meds: 3cc 1% xylocaine, 1cc (40mg ) depomedrol  Advised to call if fevers/chills, erythema, induration, drainage, or persistent bleeding.

## 2019-04-09 ENCOUNTER — Ambulatory Visit: Payer: BLUE CROSS/BLUE SHIELD | Admitting: Sports Medicine

## 2019-04-11 ENCOUNTER — Ambulatory Visit (INDEPENDENT_AMBULATORY_CARE_PROVIDER_SITE_OTHER): Payer: BLUE CROSS/BLUE SHIELD | Admitting: Sports Medicine

## 2019-04-11 ENCOUNTER — Other Ambulatory Visit: Payer: Self-pay

## 2019-04-11 VITALS — BP 120/70 | Ht 62.0 in | Wt 202.0 lb

## 2019-04-11 DIAGNOSIS — M1711 Unilateral primary osteoarthritis, right knee: Secondary | ICD-10-CM | POA: Diagnosis not present

## 2019-04-11 MED ORDER — GABAPENTIN 300 MG PO CAPS: 300.0000 mg | ORAL_CAPSULE | Freq: Every day | ORAL | 1 refills | Status: AC

## 2019-04-11 MED ORDER — DICLOFENAC SODIUM 75 MG PO TBEC: 75.0000 mg | DELAYED_RELEASE_TABLET | Freq: Two times a day (BID) | ORAL | 0 refills | Status: DC | PRN

## 2019-04-11 MED FILL — GABAPENTIN 300 MG CAPSULE: 300 | 30 days supply | Qty: 30 | Fill #0

## 2019-04-11 MED FILL — DICLOFENAC SODIUM 75 MG TAB: 75 | 20 days supply | Qty: 40 | Fill #0

## 2019-04-11 NOTE — Patient Instructions (Signed)
Knee pain is due to patellofemoral osteoarthritis (bone on bone pain under your knee cap)  We will try two medications for this:  Daytime: Take voltaren 75 mg tablet twice a day with food as needed if you are in pain  Nighttime: Take gabapentin 300 mg tablet at night for burning knee pain  We can also try viscosupplementation (a different type of injection to help with knee pain)   We can also discuss surgery to replace the knee joint. If you are interested in this, we would need to obtain an MRI image of your knee first

## 2019-04-11 NOTE — Progress Notes (Signed)
° °  Subjective:    Patient ID: [name], female    DOB: November 22, 1965, 53 y.o.   MRN: [id number]  HPI chief complaint: right knee pain  Patient comes in today for follow up of right knee pain.  She had cortisone injection in right knee on 11/21/2018 which greatly improved her knee pain with activities. She now reports 2 months of burning pain in anterior knee only at night when trying to sleep. She does not have pain during the day with activities. She has not been using any medication for pain but she has used a medication for sleep, although she cannot recall a name. She reported this at last visit and standing x-rays were completed of knee that showed moderate patellofemoral osteoarthritis.    Medical history reviewed Medications reviewed Allergies reviewed   Review of Systems No fevers, night sweats, weight changes, rashes, joint swelling    Objective:  Blood pressure 120/70, height 5\' 2"  (1.575 m), weight 202 lb (91.6 kg).  Gen.: Alert, oriented, appears stated age, in no apparent distress, interpreter for [place] is present Resp: breathing comfortably Derm: No rashes or skin lesions Neurologic:conversant, no focal defects Gait: normal without associated limp  Musculoskeletal:  Right knee: Inspection: no obvious swelling or deformity.  Palpation: No TTP over joint line ROM: full ROM with knee flexion and extension; severe patellofemoral crepitus with range of motion. Strength testing 5 out of 5 in knee  Special testing: Negative anterior posterior drawer. Negative patellar grind.  Stable to varus valgus stress. Negative Thessaly She is neurovascularly intact.      Assessment & Plan:  Right patellofemoral osteoarthritis- likely source of anterior knee pain. On exam, she has no pain with palpation, no pain with thessley's, full ROM of knee although she does have significant crepitus. Given no daytime symptoms with activities, will defer steroid injection today and opt for symptom  management with medication  Plan: - Voltaren 75 mg tablet BID PRN daytime with food  - Gabapentin 300 mg tablet nightly for burning sensation in knee - discussed trying viscosupplementation at next appointment if no improvement with medications  - discussed surgery as an option but patient would like to defer at this time. Would need to obtain an MRI image first  Patient seen and evaluated with the sports medicine fellow.  I agree with the above plan of care.  Patient may take Voltaren during the day as needed for pain and gabapentin at night.  Follow-up in 4 weeks.  We did discuss the possibility of Visco supplementation if her symptoms do not improve.  We also briefly discussed referral to orthopedics but she is not interested in surgery at this time.  Call with questions or concerns in the interim.

## 2019-05-09 ENCOUNTER — Ambulatory Visit: Payer: BLUE CROSS/BLUE SHIELD | Admitting: Sports Medicine

## 2019-05-14 ENCOUNTER — Ambulatory Visit: Payer: BLUE CROSS/BLUE SHIELD | Admitting: Sports Medicine

## 2019-05-21 ENCOUNTER — Ambulatory Visit (INDEPENDENT_AMBULATORY_CARE_PROVIDER_SITE_OTHER): Payer: BLUE CROSS/BLUE SHIELD | Admitting: Sports Medicine

## 2019-05-21 ENCOUNTER — Other Ambulatory Visit: Payer: Self-pay

## 2019-05-21 VITALS — BP 121/81 | Wt 218.0 lb

## 2019-05-21 DIAGNOSIS — M545 Low back pain: Secondary | ICD-10-CM | POA: Diagnosis not present

## 2019-05-21 DIAGNOSIS — G8929 Other chronic pain: Secondary | ICD-10-CM | POA: Diagnosis not present

## 2019-05-21 DIAGNOSIS — M1711 Unilateral primary osteoarthritis, right knee: Secondary | ICD-10-CM | POA: Diagnosis not present

## 2019-05-22 NOTE — Progress Notes (Signed)
   Subjective:    Patient ID: [name], female    DOB: Oct 21, 1965, 53 y.o.   MRN: [id number]  HPI   Patient comes in today for follow-up on right knee pain secondary to patellofemoral DJD.  Symptoms are currently tolerable with both Voltaren and Neurontin.  She also takes an occasional muscle relaxer.  Main complaint today is diffuse low back pain.  No recent trauma.  No numbness or tingling in her legs.   Review of Systems    As above Objective:   Physical Exam  Well-developed, well-nourished.  No acute distress.  Awake alert and oriented x3.  Vital signs reviewed  Lumbar spine: Good lumbar range of motion.  Slight tenderness to palpation diffusely along the paraspinal musculature bilaterally.  No spasm.  Neurological exam shows no gross focal neurological deficit of either lower extremity.  Right knee: Full range of motion.  No effusion.  3+ patellofemoral crepitus.  Joint stable to ligamentous exam.  Neurovascularly intact distally.      Assessment & Plan:   Right knee pain secondary to patellofemoral DJD Low back pain likely secondary to lumbar strain  I think the patient would benefit from formal physical therapy.  She will continue on her Voltaren and Neurontin as needed for pain.  Follow-up with me again in 4 weeks for reevaluation.  If back pain persists, consider x-rays at that time.  Call with questions or concerns in the interim.

## 2019-05-30 ENCOUNTER — Other Ambulatory Visit: Payer: Self-pay

## 2019-05-30 DIAGNOSIS — Z20822 Contact with and (suspected) exposure to covid-19: Secondary | ICD-10-CM

## 2019-06-01 LAB — NOVEL CORONAVIRUS, NAA: SARS-CoV-2, NAA: NOT DETECTED

## 2019-06-05 ENCOUNTER — Emergency Department (HOSPITAL_COMMUNITY)
Admission: EM | Admit: 2019-06-05 | Discharge: 2019-06-05 | Disposition: A | Discharge status: Home / Self Care | Payer: BLUE CROSS/BLUE SHIELD | Attending: Emergency Medicine | Admitting: Emergency Medicine

## 2019-06-05 ENCOUNTER — Other Ambulatory Visit: Payer: Self-pay | Admitting: *Deleted

## 2019-06-05 DIAGNOSIS — Z139 Encounter for screening, unspecified: Secondary | ICD-10-CM

## 2019-06-05 DIAGNOSIS — Z20822 Contact with and (suspected) exposure to covid-19: Secondary | ICD-10-CM

## 2019-06-05 DIAGNOSIS — Z0489 Encounter for examination and observation for other specified reasons: Secondary | ICD-10-CM | POA: Diagnosis not present

## 2019-06-05 NOTE — Discharge Instructions (Addendum)
Please contact the [facility] for guidance on testing.  The emergency room is not the appropriate place for pretravel COVID screening

## 2019-06-05 NOTE — ED Provider Notes (Signed)
[facility] Provider Note   CSN: [id number] Arrival date & time: 06/05/19  [id number]     History   Chief Complaint No chief complaint on file.   HPI [name] is a 53 y.o. female.     HPI   53 year old female presents today for cover test.  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  She denies any symptoms here today.  Past Medical History:  Diagnosis Date  . GERD (gastroesophageal reflux disease)   . Knee pain   . Lower back pain     Patient Active Problem List   Diagnosis Date Noted  . Greater trochanteric bursitis of left hip 01/24/2018  . Right shoulder pain 07/24/2016  . Atypical chest pain 06/17/2016  . Osteoarthritis of right knee 04/21/2016  . Left shoulder pain 05/15/2014  . Low back pain 12/25/2013  . Right hip pain 11/29/2013  . Headache(784.0) 01/22/2013  . Hyperlipidemia LDL goal < 160 07/12/2012  . Tendinopathy of rotator cuff, right 06/26/2012  . Language barrier 12/08/2011  . Ganglion cyst 12/08/2011  . Obesity (BMI 30.0-34.9) 12/08/2011    Past Surgical History:  Procedure Laterality Date  . DIRECT LARYNGOSCOPY N/A 07/12/2018   Procedure: DIRECT LARYNGOSCOPY;  Surgeon: Newman Pieseoh, Su, MD;  Location: [facility] OR;  Service: ENT;  Laterality: N/A;  . knee injections    . shoulder injections       OB History   No obstetric history on file.      Home Medications    Prior to Admission medications   Medication Sig Start Date End Date Taking? Authorizing Provider  diclofenac (VOLTAREN) 75 MG EC tablet Take 1 tablet (75 mg total) by mouth 2 (two) times daily as needed. 04/11/19   Ralene Corkraper, Timothy R, DO  gabapentin (NEURONTIN) 300 MG capsule Take 1 capsule (300 mg total) by mouth at bedtime. 04/11/19   Ralene Corkraper, Timothy R, DO  Multiple Vitamin (MULTIVITAMIN) tablet Take 1 tablet by mouth daily. Doesn't take daily    [provider]  naproxen (NAPROSYN) 500  MG tablet Take 1 tablet (500 mg total) by mouth 2 (two) times daily. Patient not taking: Reported on 11/21/2018 11/16/18   Carlyle BasquesHernandez, Ana P, PA-C  pantoprazole (PROTONIX) 40 MG tablet Take 40 mg by mouth daily.    [provider]  predniSONE (DELTASONE) 20 MG tablet 3 tabs po day one, then 2 tabs daily x 4 days Patient not taking: Reported on 09/05/2018 05/28/18   Rolan BuccoBelfi, Melanie, MD  tiZANidine (ZANAFLEX) 4 MG tablet  11/21/16   [provider]  traMADol (ULTRAM) 50 MG tablet Take 1 tablet (50 mg total) by mouth every 6 (six) hours as needed. Patient not taking: Reported on 11/21/2018 05/28/18   Rolan BuccoBelfi, Melanie, MD    Family History Family History  Problem Relation Age of Onset  . Sudden death Neg Hx   . Hyperlipidemia Neg Hx   . Heart attack Neg Hx   . Diabetes Neg Hx   . Hypertension Neg Hx   . Heart disease Neg Hx   . Colon cancer Neg Hx   . Colon polyps Neg Hx   . Esophageal cancer Neg Hx   . Rectal cancer Neg Hx   . Stomach cancer Neg Hx     Social History Social History   Tobacco Use  . Smoking status: Never Smoker  . Smokeless tobacco: Never Used  Substance Use Topics  .  Alcohol use: No  . Drug use: No     Allergies   Patient has no known allergies.   Review of Systems Review of Systems  All other systems reviewed and are negative.    Physical Exam Updated Vital Signs BP 127/90 (BP Location: Left Arm)   Pulse 75   Temp 97.8 F (36.6 C) (Oral)   Resp 16   SpO2 98%   Physical Exam Vitals signs and nursing note reviewed.  Constitutional:      Appearance: She is well-developed.  HENT:     Head: Normocephalic and atraumatic.  Eyes:     General: No scleral icterus.       Right eye: No discharge.        Left eye: No discharge.     Conjunctiva/sclera: Conjunctivae normal.     Pupils: Pupils are equal, round, and reactive to light.  Neck:     Musculoskeletal: Normal range of motion.     Vascular: No JVD.     Trachea: No tracheal  deviation.  Pulmonary:     Effort: Pulmonary effort is normal.     Breath sounds: No stridor.  Neurological:     Mental Status: She is alert and oriented to person, place, and time.     Coordination: Coordination normal.  Psychiatric:        Behavior: Behavior normal.        Thought Content: Thought content normal.        Judgment: Judgment normal.      ED Treatments / Results  Labs (all labs ordered are listed, but only abnormal results are displayed) Labs Reviewed - No data to display  EKG None  Radiology No results found.  Procedures Procedures (including critical care time)  Medications Ordered in ED Medications - No data to display   Initial Impression / Assessment and Plan / ED Course  I have reviewed the triage vital signs and the nursing notes.  Pertinent labs & imaging results that were available during my care of the patient were reviewed by me and considered in my medical decision making (see chart for details).        Patient here for asymptomatic screening for travel.  She will be referred to outpatient screening resources.  She has no symptoms here.  Final Clinical Impressions(s) / ED Diagnoses   Final diagnoses:  Encounter for screening    ED Discharge Orders    None       Okey Regal, PA-C 06/05/19 [id number]    Tegeler, Gwenyth Allegra, MD 06/05/19 [phone]

## 2019-06-06 LAB — NOVEL CORONAVIRUS, NAA: SARS-CoV-2, NAA: NOT DETECTED

## 2019-10-31 ENCOUNTER — Ambulatory Visit: Payer: BLUE CROSS/BLUE SHIELD | Admitting: Sports Medicine

## 2019-10-31 ENCOUNTER — Other Ambulatory Visit: Payer: Self-pay

## 2019-10-31 VITALS — BP 137/94 | Ht 62.0 in | Wt 218.0 lb

## 2019-10-31 DIAGNOSIS — M542 Cervicalgia: Secondary | ICD-10-CM | POA: Diagnosis not present

## 2019-10-31 DIAGNOSIS — G8929 Other chronic pain: Secondary | ICD-10-CM

## 2019-10-31 DIAGNOSIS — M25511 Pain in right shoulder: Secondary | ICD-10-CM

## 2019-10-31 DIAGNOSIS — M25512 Pain in left shoulder: Secondary | ICD-10-CM

## 2019-10-31 DIAGNOSIS — M25562 Pain in left knee: Secondary | ICD-10-CM | POA: Diagnosis not present

## 2019-10-31 MED ORDER — TIZANIDINE HCL 4 MG PO TABS: 4.0000 mg | ORAL_TABLET | Freq: Every day | ORAL | 0 refills | Status: DC | PRN

## 2019-10-31 MED ORDER — METHYLPREDNISOLONE ACETATE 40 MG/ML IJ SUSP
40.0000 mg | Freq: Once | INTRAMUSCULAR | Status: AC
  Administered 2019-10-31: 40 mg via INTRA_ARTICULAR

## 2019-10-31 MED FILL — tiZANidine HCL 4 MG TABS: 4 | 30 days supply | Qty: 30 | Fill #0

## 2019-10-31 NOTE — Progress Notes (Signed)
[name] - 54 y.o. female MRN [id number]  Date of birth: [birth year]  SUBJECTIVE:   CC: L knee, bilateral shoulder pain, neck pain    54 yo female presenting with multiple chronic complaints. Left knee: she has medial knee pain. 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 Responded well to CSI in the past. She reports that her left knee is now bothering her more than her right and a few days ago it hurt to walk. Pain mostly in medial and posterior knee. No locking/catching/giving way. No numbness/tingling  Bilateral shoulder pain- pain in lateral shoulders, worse with overhead work. No acute injury. Gradually worsening pain. No numbness/tingling  Neck pain- pain in upper neck, left side. Massage (from a family member) helped some.  She has taken tizanidine in the past which has helped with back/neck/shoulder pain. She has been taking 400 mg ibuprofen with minimal relief.  She is leaving for [place] next week (Feb 3) to visit her sick mother. Unsure of how long she will be away.   ROS: No unexpected weight loss, fever, chills, swelling, instability, muscle pain, numbness/tingling, redness, otherwise see HPI   PMHx - Updated and reviewed.  Contributory factors include: Negative PSHx - Updated and reviewed.  Contributory factors include:  Negative FHx - Updated and reviewed.  Contributory factors include:  Negative Social Hx - Updated and reviewed. 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  PHYSICAL EXAM:  VS: BP:   HR: bpm  TEMP: ( )  RESP:   HT:    WT:   BMI:  PHYSICAL EXAM: Gen: NAD, alert, cooperative with exam, well-appearing HEENT: clear conjunctiva,  CV:  no edema, capillary refill brisk, normal rate Resp: non-labored Skin: no rashes, normal turgor  Neuro: no gross deficits.    Psych:  alert and oriented  Right Knee: - Inspection: no gross deformity. No swelling/effusion, erythema or bruising. Skin intact - Palpation: TTP along medial joint line - ROM: full active ROM with flexion and extension in knee and hip, 3 + crepitus - Strength: 5/5 strength - Neuro/vasc: NV intact - Special Tests: - LIGAMENTS: ligaments stable, psuedolaxity with valgus stress  -- MENISCUS: negative McMurray's, negative Thessaly  -- PF JOINT: nml patellar mobility bilaterally  Hips: normal ROM, negative FABER and FADIR bilaterally  Right Shoulder: Inspection reveals no obvious deformity, atrophy, or asymmetry. No bruising. No swelling Palpation TTP over lateral acromion Full ROM in flexion, abduction, internal/external rotation -- pain with abduction over 90 degrees NV intact distally Normal scapular function observed. Special Tests:  - Impingement: Positive Hawkins,  + neers, + empty can sign. - Supraspinatous: + empty can.  5/5 strength with resisted flexion at 20 degrees with mild pain - Infraspinatous/Teres Minor: 5/5 strength with ER - Subscapularis: negative belly press, negative bear hug. 5/5 strength with IR - Biceps tendon: Negative Speeds, Yerrgason's  Painful arc   Left shoulder: Inspection reveals no obvious deformity, atrophy, or asymmetry. No bruising. No swelling Palpation TTP over lateral acromion extending into lateral deltoid Full ROM in flexion, abduction, internal/external rotation -- pain with abduction over 90 degrees NV intact distally Normal scapular function observed. Special Tests:  - Impingement: Positive Hawkins,  + neers, + empty can sign. - Supraspinatous: + empty can.  5/5 strength with resisted flexion at 20 degrees with mild pain - Infraspinatous/Teres  Minor: 5/5 strength with ER - Subscapularis: negative belly press, negative bear hug. 5/5 strength with IR - Biceps tendon: Negative Speeds, Yerrgason's  Painful arc   Neck: - Inspection:  no gross deformity or asymmetry, swelling or ecchymosis - Palpation: No TTP over spinous process, TTP over upper left trapezius muscle at base of skull with trigger points appreciated - ROM: full active ROM of the cervical spine with neck extension, rotation, flexion  - Strength: 5/5 wrist flexion, extension, biceps flexion. 4/5 triceps extension. OK sign, interosseus strength intact  - Neuro: sensation intact in the C5-C8 nerve root distribution b/l - Special testing: negative spurlings    ASSESSMENT & PLAN:  54 yo female presenting with chronic left knee pain, bilateral shoulder pain, and neck pain.   1. Left Knee pain: Suspect that her pain is likely due to arthritis. Left knee pain and exam appear to be consistent with medial joint DJD that is similar to x-ray of right knee from 2020. Given that she is leaving the country next week, will hold off on further imaging at this point but when she returns, she would benefit from bilateral standing films of both knees to see if she would benefit from offloading knee braces. She elected for corticosteroid injection of left knee today. She would benefit from PT and will place referral when she returns from [place]. Provided isometric quad exercises today. Recommended increasing dose of ibuprofen to 800 mg for better pain relief.  Procedure performed: knee intraarticular corticosteroid injection; palpation guided  Consent obtained and verified. Time-out conducted. Noted no overlying erythema, induration, or other signs of local infection. The left superior-lateral pallar space was palpated and marked. The overlying skin was prepped in a sterile fashion. Topical analgesic spray: Ethyl chloride. Joint: left knee Needle: 25 gauge, 1.5 inch Completed without difficulty. Meds: 40 mg solumedrol, 4 ml 1% lidocaine without epinephrine  Advised to call if fevers/chills, erythema, induration, drainage, or persistent bleeding.  Consent obtained and  verified. Time-out conducted.  2. Bilateral shoulder pain- signs of impingement bilaterally with rotator cuff irritation. She has good strength and ROM on exam. Suspect that she may have a degenerative tear vs irritation of rotator cuff. Will start with home exercise program with rotator cuff exercises. When she returns from [place], she would like benefit from formal PT and will place referral at that time.   3. Neck pain- trigger point in upper left trap with general tight muscles in upper back. Reviewed neck stretches with her to do at home. Will refer to PT when she is back from [place]. Provided refill for tizanidine as this has helped in the past.  Patient seen and evaluated with the sports medicine fellow.  I agree with the above plan of care.

## 2019-11-04 ENCOUNTER — Ambulatory Visit: Payer: BLUE CROSS/BLUE SHIELD | Attending: Internal Medicine

## 2019-11-04 DIAGNOSIS — Z20822 Contact with and (suspected) exposure to covid-19: Secondary | ICD-10-CM

## 2019-11-05 LAB — NOVEL CORONAVIRUS, NAA: SARS-CoV-2, NAA: NOT DETECTED

## 2020-04-09 ENCOUNTER — Ambulatory Visit: Payer: Self-pay | Admitting: Sports Medicine

## 2020-04-16 ENCOUNTER — Ambulatory Visit (INDEPENDENT_AMBULATORY_CARE_PROVIDER_SITE_OTHER): Payer: Self-pay | Admitting: Sports Medicine

## 2020-04-16 ENCOUNTER — Other Ambulatory Visit: Payer: Self-pay

## 2020-04-16 VITALS — BP 118/82 | Ht 62.0 in | Wt 200.0 lb

## 2020-04-16 DIAGNOSIS — M1712 Unilateral primary osteoarthritis, left knee: Secondary | ICD-10-CM

## 2020-04-16 DIAGNOSIS — G8929 Other chronic pain: Secondary | ICD-10-CM

## 2020-04-16 DIAGNOSIS — M545 Low back pain: Secondary | ICD-10-CM

## 2020-04-16 DIAGNOSIS — Z6836 Body mass index (BMI) 36.0-36.9, adult: Secondary | ICD-10-CM

## 2020-04-16 DIAGNOSIS — M1711 Unilateral primary osteoarthritis, right knee: Secondary | ICD-10-CM

## 2020-04-16 DIAGNOSIS — E669 Obesity, unspecified: Secondary | ICD-10-CM

## 2020-04-16 MED ORDER — METHYLPREDNISOLONE ACETATE 40 MG/ML IJ SUSP
40.0000 mg | Freq: Once | INTRAMUSCULAR | Status: AC
  Administered 2020-04-16: 40 mg via INTRA_ARTICULAR

## 2020-04-16 NOTE — Addendum Note (Signed)
Addended by: Reino Bellis R on: 04/16/2020 05:10 PM   Modules accepted: Level of Service

## 2020-04-16 NOTE — Progress Notes (Addendum)
Office Visit Note   Patient: [name]           Date of Birth: 03/11/1966           MRN: [id number] Visit Date: 04/16/2020 Requested by: Garnette Gunner, MD No address on file PCP: Garnette Gunner, MD (Inactive)  Subjective: Bilateral knee Pain, Low back pain  HPI:  Foreign language interpreter was used to assist with communication throughout encounter.   Patient is a 54 year old female presenting to clinic to follow-up on bilateral knee pain as well as with new concerns of lower back pain.  Patient was previously seen in late January with concerns of left knee pain at which time she was given a steroid injection.  She states that the steroid injection offered significant improvement for several months and she is interested in having a repeated procedure.  She says that she has identical pain in the right knee, and would like to have a similar injection on this knee as well if possible.  Right knee pain is located primarily in the medial aspect as well as beneath the patella and is worse with walking, or going up stairs.  Patient states that this is identical to the symptoms that she was getting on the left.  For her back pain, patient states that her back pain has been chronic ever since the birth of her children.  It is focused bilaterally just above her hips and is worse with bending forward.  She tried taking Motrin and Tylenol from time to time if she needs to which seems to help.  She has never tried physical therapy or home exercises to improve her back pain.  Pain does not radiate into her legs, she has never experienced any numbness or weakness in her feet or lower extremities.           ROS:  All other systems were reviewed and are negative.  Objective: Vital Signs: BP 118/82   Ht 5\' 2"  (1.575 m)   Wt 200 lb (90.7 kg)   BMI 36.58 kg/m   Physical Exam:  General:  Alert and oriented, in no acute distress. Cards: Appears well perfused Pulm:  Breathing unlabored. Psy:   Normal mood, congruent affect. Skin: Bilateral knees with no obvious swelling bruising deformity or rashes. MSK:  KNEE EXAMINATIONS: No gross deformity. No Varus/Valgus deformity of the lower extremities.   TTP along bilateral medial joint lines, R>L. Full active ROM with flexion and extension in knee and hip, significant crepitus bilaterally 5/5 strength with knee flexion and extension  Brisk capillary refill distally. Sensation intact.  Special Tests:  Negative Drawer Test. Psuedolaxity with valgus stress, with mild appreciable crepitus.  Negative McMurray's  BACK EXAMINATION:  Increased Lumbar lordosis. Normal gait. Pain with palpation of bilateral SI Joints. Osteopathic examination with [id number]. RonL FST. Negative Spring's test.  SLR Negative bilaterally.   Imaging: RIGHT KNEE: 03/18/2019 CLINICAL DATA:  Chronic right knee pain.  No injury.  EXAM: RIGHT KNEE 3 VIEWS  COMPARISON:  11/16/2018  FINDINGS: There mild tricompartmental osteoarthritic changes. No evidence of fracture or dislocation. No significant joint effusion. Remainder of the exam is unchanged.  IMPRESSION: No acute findings.  Mild tricompartmental osteoarthritis.   Electronically Signed   By: 11/18/2018 M.D.   On: 03/18/2019 15:31  PROCEDURE: Right knee Corticosteroid Injection:  Consent obtained and verified. Sterile betadine prep. Furthur cleansed with alcohol. Topical analgesic spray: Ethyl chloride. Joint: RIGHT knee Approached in typical fashion with: Anteriolateral Completed without  difficulty Meds: 3cc 1% xylocaine 1cc (40mg ) depomedrol Needle: 25g 1.5in Aftercare instructions and Red flags advised.  Assessment & Plan: 54 year old female presenting to clinic with bilateral chronic knee pain right worse than left, as well as bilateral lower back pain.  Knee examination as described above which is consistent with osteoarthritis, most significant in the medial compartment.  As  she recently had a steroid injection in the left knee it is too soon to repeat this procedure at this time, however patient opted to have a corticosteroid injection in the right knee today.  Procedure performed as described above with patient tolerated very well with no immediate complications.  May consider offloading brace in the future, hyaluronic acid injections, or physical therapy in the future if needed.  Patient was encouraged to return to clinic in approximately 1 month should she require an repeated injection in her left knee as well.  Patient seen and evaluated with the sports medicine fellow.  I agree with the above plan of care.  Cortisone injection as above into the right knee.  Home exercises for her low back pain.  Follow-up as needed.  For her low back pain, patient was given home exercise instructions by athletic trainer in clinic.  Importance of these home exercises was stressed with patient including weight loss to help offload her spine.  Return to clinic if no improvement with home exercises in 4 to 6 weeks.  Patient expresses understanding and had no further questions or concerns.

## 2020-04-24 ENCOUNTER — Encounter (HOSPITAL_COMMUNITY): Payer: Self-pay | Admitting: Pharmacy Technician

## 2020-04-24 ENCOUNTER — Emergency Department (HOSPITAL_COMMUNITY)
Admission: EM | Admit: 2020-04-24 | Discharge: 2020-04-25 | Disposition: A | Discharge status: Home / Self Care | Payer: Self-pay | Attending: Emergency Medicine | Admitting: Emergency Medicine

## 2020-04-24 DIAGNOSIS — M542 Cervicalgia: Secondary | ICD-10-CM

## 2020-04-24 DIAGNOSIS — M62830 Muscle spasm of back: Secondary | ICD-10-CM | POA: Insufficient documentation

## 2020-04-24 DIAGNOSIS — Z79899 Other long term (current) drug therapy: Secondary | ICD-10-CM | POA: Insufficient documentation

## 2020-04-24 NOTE — ED Triage Notes (Signed)
Pt reports back pain radiating to her ribs onset 2 days ago. Denies injury. Has been taking ibu without relief.

## 2020-04-25 MED ORDER — KETOROLAC TROMETHAMINE 60 MG/2ML IM SOLN
30.0000 mg | Freq: Once | INTRAMUSCULAR | Status: AC
  Administered 2020-04-25: 30 mg via INTRAMUSCULAR
  Filled 2020-04-25: qty 2

## 2020-04-25 MED ORDER — TIZANIDINE HCL 4 MG PO TABS: 4.0000 mg | ORAL_TABLET | Freq: Every day | ORAL | 0 refills | Status: DC | PRN

## 2020-04-25 MED ORDER — DIAZEPAM 5 MG/ML IJ SOLN
2.5000 mg | Freq: Once | INTRAMUSCULAR | Status: AC
  Administered 2020-04-25: 2.5 mg via INTRAMUSCULAR
  Filled 2020-04-25: qty 2

## 2020-04-25 NOTE — ED Provider Notes (Signed)
[facility] EMERGENCY DEPARTMENT Provider Note  CSN: [id number] Arrival date & time: 04/24/20 1508  Chief Complaint(s) Back Pain  HPI [name] is a 54 y.o. female  CC: mid back pain  Onset/Duration: Gradual onset for 2 days Timing: Constant/worsening Location: Right mid back radiating around to the front Quality: Cramping Severity: Moderate to severe Modifying Factors:  Improved by: Certain positions and being still  Worsened by: Palpation of the paraspinal/parascapular muscles on the right, twisting, bending. Associated Signs/Symptoms:  Pertinent (+): None  Pertinent (-): Chest pain, shortness of breath, numbness, tingling, focal deficits, bladder/bowel incontinence. Context: Similar to prior episodes in the past from muscle spasms.  Patient denies any falls or trauma   HPI  Past Medical History Past Medical History:  Diagnosis Date  . GERD (gastroesophageal reflux disease)   . Knee pain   . Lower back pain    Patient Active Problem List   Diagnosis Date Noted  . Greater trochanteric bursitis of left hip 01/24/2018  . Right shoulder pain 07/24/2016  . Atypical chest pain 06/17/2016  . Osteoarthritis of right knee 04/21/2016  . Left shoulder pain 05/15/2014  . Low back pain 12/25/2013  . Right hip pain 11/29/2013  . Headache(784.0) 01/22/2013  . Hyperlipidemia LDL goal < 160 07/12/2012  . Tendinopathy of rotator cuff, right 06/26/2012  . Language barrier 12/08/2011  . Ganglion cyst 12/08/2011  . Obesity (BMI 30.0-34.9) 12/08/2011   Home Medication(s) Prior to Admission medications   Medication Sig Start Date End Date Taking? Authorizing Provider  diclofenac (VOLTAREN) 75 MG EC tablet Take 1 tablet (75 mg total) by mouth 2 (two) times daily as needed. 04/11/19   Ralene Cork, DO  gabapentin (NEURONTIN) 300 MG capsule Take 1 capsule (300 mg total) by mouth at bedtime. 04/11/19   Ralene Cork, DO  Multiple Vitamin (MULTIVITAMIN) tablet  Take 1 tablet by mouth daily. Doesn't take daily    [provider]  pantoprazole (PROTONIX) 40 MG tablet Take 40 mg by mouth daily.    [provider]  tiZANidine (ZANAFLEX) 4 MG tablet Take 1 tablet (4 mg total) by mouth daily as needed for muscle spasms. 04/25/20   Nira Conn, MD                                                                                                                                    Past Surgical History Past Surgical History:  Procedure Laterality Date  . DIRECT LARYNGOSCOPY N/A 07/12/2018   Procedure: DIRECT LARYNGOSCOPY;  Surgeon: Newman Pies, MD;  Location: [facility] OR;  Service: ENT;  Laterality: N/A;  . knee injections    . shoulder injections     Family History Family History  Problem Relation Age of Onset  . Sudden death Neg Hx   . Hyperlipidemia Neg Hx   . Heart attack Neg Hx   . Diabetes Neg Hx   .  Hypertension Neg Hx   . Heart disease Neg Hx   . Colon cancer Neg Hx   . Colon polyps Neg Hx   . Esophageal cancer Neg Hx   . Rectal cancer Neg Hx   . Stomach cancer Neg Hx     Social History Social History   Tobacco Use  . Smoking status: Never Smoker  . Smokeless tobacco: Never Used  Vaping Use  . Vaping Use: Never used  Substance Use Topics  . Alcohol use: No  . Drug use: No   Allergies Patient has no known allergies.  Review of Systems Review of Systems All other systems are reviewed and are negative for acute change except as noted in the HPI  Physical Exam Vital Signs  I have reviewed the triage vital signs BP 116/79 (BP Location: Right Arm)   Pulse 73   Temp 98.1 F (36.7 C) (Oral)   Resp 18   SpO2 100%   Physical Exam Vitals reviewed.  Constitutional:      General: She is not in acute distress.    Appearance: She is well-developed. She is not diaphoretic.  HENT:     Head: Normocephalic and atraumatic.     Right Ear: External ear normal.     Left Ear: External ear normal.     Nose: Nose  normal.  Eyes:     General: No scleral icterus.    Conjunctiva/sclera: Conjunctivae normal.  Neck:     Trachea: Phonation normal.  Cardiovascular:     Rate and Rhythm: Normal rate and regular rhythm.  Pulmonary:     Effort: Pulmonary effort is normal. No respiratory distress.     Breath sounds: No stridor.  Abdominal:     General: There is no distension.  Musculoskeletal:        General: Normal range of motion.     Cervical back: Normal range of motion. No spasms, tenderness or bony tenderness.     Thoracic back: Spasms and tenderness present. No bony tenderness.     Lumbar back: No spasms, tenderness or bony tenderness.       Back:  Neurological:     Mental Status: She is alert and oriented to person, place, and time.     Comments: Spine Exam: Strength: 5/5 throughout LE bilaterally Sensation: Intact to light touch in proximal and distal LE bilaterally  Psychiatric:        Behavior: Behavior normal.     ED Results and Treatments Labs (all labs ordered are listed, but only abnormal results are displayed) Labs Reviewed - No data to display                                                                                                                       EKG  EKG Interpretation  Date/Time:    Ventricular Rate:    PR Interval:    QRS Duration:   QT Interval:    QTC Calculation:   R Axis:  Text Interpretation:        Radiology No results found.  Pertinent labs & imaging results that were available during my care of the patient were reviewed by me and considered in my medical decision making (see chart for details).  Medications Ordered in ED Medications  ketorolac (TORADOL) injection 30 mg (has no administration in time range)  diazepam (VALIUM) injection 2.5 mg (has no administration in time range)                                                                                                                                     Procedures Procedures  (including critical care time)  Medical Decision Making / ED Course I have reviewed the nursing notes for this encounter and the patient's prior records (if available in EHR or on provided paperwork).   [name] was evaluated in Emergency Department on 04/25/2020 for the symptoms described in the history of present illness. She was evaluated in the context of the global COVID-19 pandemic, which necessitated consideration that the patient might be at risk for infection with the SARS-CoV-2 virus that causes COVID-19. Institutional protocols and algorithms that pertain to the evaluation of patients at risk for COVID-19 are in a state of rapid change based on information released by regulatory bodies including the CDC and federal and state organizations. These policies and algorithms were followed during the patient's care in the ED.  Mid back pain consistent with prior muscle spasms. No trauma No red flag symptoms of fever, weight loss, saddle anesthesia, weakness, fecal/urinary incontinence or urinary retention.  Doubt PE. No urinary symptoms pointing towards pyelonephritis. No indication for imaging emergently.    Patient was recommended to take short course of scheduled NSAIDs and engage in early mobility as definitive treatment. Return precautions discussed for worsening or new concerning symptoms.        Final Clinical Impression(s) / ED Diagnoses Final diagnoses:  Muscle spasm of back   The patient appears reasonably screened and/or stabilized for discharge and I doubt any other medical condition or other [facility] further screening, evaluation, or treatment in the ED at this time prior to discharge. Safe for discharge with strict return precautions.  Disposition: Discharge  Condition: Good  I have discussed the results, Dx and Tx plan with the patient/family who expressed understanding and agree(s) with the plan. Discharge instructions discussed  at length. The patient/family was given strict return precautions who verbalized understanding of the instructions. No further questions at time of discharge.    ED Discharge Orders         Ordered    tiZANidine (ZANAFLEX) 4 MG tablet  Daily PRN     Discontinue  Reprint     04/25/20 0041            Follow Up: Primary care provider  Schedule an appointment as soon as possible for a visit  in 5-7 days, If  symptoms do not improve or  worsen      This chart was dictated using voice recognition software.  Despite best efforts to proofread,  errors can occur which can change the documentation meaning.   Nira Conn, MD 04/25/20 [phone]

## 2020-04-25 NOTE — ED Notes (Signed)
Witnessed Valium administration with Nutritional therapist.

## 2020-04-25 NOTE — Discharge Instructions (Signed)
You may use over-the-counter Motrin (Ibuprofen), Acetaminophen (Tylenol), topical muscle creams such as SalonPas, Icy Hot, Bengay, etc. Please stretch, apply heat, and have massage therapy for additional assistance. ° °

## 2020-04-27 MED FILL — tiZANidine HCL 4 MG TABS: 4 | 30 days supply | Qty: 30 | Fill #0

## 2020-06-30 ENCOUNTER — Ambulatory Visit (INDEPENDENT_AMBULATORY_CARE_PROVIDER_SITE_OTHER): Payer: Self-pay | Admitting: Sports Medicine

## 2020-06-30 ENCOUNTER — Other Ambulatory Visit: Payer: Self-pay

## 2020-06-30 VITALS — BP 149/93 | Ht 62.0 in | Wt 200.0 lb

## 2020-06-30 DIAGNOSIS — M1711 Unilateral primary osteoarthritis, right knee: Secondary | ICD-10-CM

## 2020-06-30 DIAGNOSIS — M1712 Unilateral primary osteoarthritis, left knee: Secondary | ICD-10-CM

## 2020-06-30 MED ORDER — METHYLPREDNISOLONE ACETATE 40 MG/ML IJ SUSP
40.0000 mg | Freq: Once | INTRAMUSCULAR | Status: AC
  Administered 2020-06-30: 40 mg via INTRA_ARTICULAR

## 2020-06-30 NOTE — Patient Instructions (Signed)
We will refer you to the orthopedic surgeon for your knee pain. On your first visit you will be required to pay $100 up front. You will then get a 56% discount on future bills. You can set up a payment plan as well.

## 2020-07-01 ENCOUNTER — Encounter: Payer: Self-pay | Admitting: Sports Medicine

## 2020-07-01 NOTE — Progress Notes (Signed)
   Subjective:    Patient ID: [name], female    DOB: [birth year], 54 y.o.   MRN: [id number]  HPI chief complaint: Bilateral knee pain  Patient returns to the office with bilateral knee pain.  She has done well with cortisone injections in the past.  A few days ago the left knee became acutely painful and swollen.  It is slowly improving.  Previous x-rays of the right knee showed moderate medial compartmental DJD.  She denies any recent injury.    Review of Systems As above    Objective:   Physical Exam  Well-developed, well-nourished.  No acute distress  Examination of the right knee shows Mcfarland range of motion.  Trace effusion.  She is tender to palpation along the medial joint line but negative McMurray's.  No tenderness laterally.  Knee is grossly stable to ligamentous exam.  Examination of the left knee shows trace to 1+ effusion.  Limited range of motion secondary to pain.  She is tender to palpation along the medial and lateral joint lines.  Pain but no popping with McMurray's.  Knee is grossly stable to ligamentous exam.  No erythema.  Neurovascularly intact distally.  Patient ambulating with a noticeable limp.      Assessment & Plan:   Returning bilateral knee pain secondary to DJD  I will reinject each knee today with cortisone.  I would also like to refer the patient to orthopedics to discuss further treatment.  I will defer further work-up and treatment to their discretion.  Patient will follow up with me as needed.  Consent obtained and verified. Time-out conducted. Noted no overlying erythema, induration, or other signs of local infection. Skin prepped in a sterile fashion. Topical analgesic spray: Ethyl chloride. Joint: left knee Needle: 25g 1.5 inch Completed without difficulty. Meds: 3cc 1% xylocaine, 1cc (40mg ) depomedrol  Advised to call if fevers/chills, erythema, induration, drainage, or persistent bleeding.  Consent obtained and verified. Time-out  conducted. Noted no overlying erythema, induration, or other signs of local infection. Skin prepped in a sterile fashion. Topical analgesic spray: Ethyl chloride. Joint: right knee Needle: 25g 1.5 inch Completed without difficulty. Meds: 3cc 1% xylocaine, 1cc (40mg ) depomedrol  Advised to call if fevers/chills, erythema, induration, drainage, or persistent bleeding.

## 2020-07-15 ENCOUNTER — Ambulatory Visit: Payer: Self-pay | Admitting: Orthopedic Surgery

## 2020-07-27 ENCOUNTER — Ambulatory Visit: Payer: Self-pay | Admitting: Orthopedic Surgery

## 2020-07-27 ENCOUNTER — Ambulatory Visit: Payer: Self-pay

## 2020-07-27 ENCOUNTER — Telehealth: Payer: Self-pay

## 2020-07-27 ENCOUNTER — Encounter: Payer: Self-pay | Admitting: Orthopedic Surgery

## 2020-07-27 ENCOUNTER — Ambulatory Visit (INDEPENDENT_AMBULATORY_CARE_PROVIDER_SITE_OTHER): Payer: Self-pay

## 2020-07-27 VITALS — Ht 61.0 in | Wt 207.0 lb

## 2020-07-27 DIAGNOSIS — M25562 Pain in left knee: Secondary | ICD-10-CM

## 2020-07-27 DIAGNOSIS — M25561 Pain in right knee: Secondary | ICD-10-CM

## 2020-07-27 NOTE — Progress Notes (Signed)
Office Visit Note   Patient: [name]           Date of Birth: 1965/12/11           MRN: [id number] Visit Date: 07/27/2020 Requested by: Ralene Cork, DO [address] [place],  [place] [address] PCP: Garnette Gunner, MD  Subjective: Chief Complaint  Patient presents with  . Left Knee - Pain  . Right Knee - Pain    HPI: Patient presents for evaluation bilateral knee pain left worse than right.  Reports chronic pain for years.  Describes swelling weakness and the pain will occasionally wake her from sleep at night.  Denies a history of injury.  Had cortisone injections about a month ago which did help some.  Takes ibuprofen.  She works at [employer] and has to stand upwards of 7 hours a day.  Reports global pain in both knees but the left is worse than the right.  Denies much in the way of mechanical symptoms.  [place] translator is present today for communication purposes.              ROS: All systems reviewed are negative as they relate to the chief complaint within the history of present illness.  Patient denies  fevers or chills.   Assessment & Plan: Visit Diagnoses:  1. Pain in both knees, unspecified chronicity     Plan: Impression is bilateral knee pain left worse than right.  She has end-stage left knee arthritis.  We talked about the risk and benefits of knee replacement as well as the difficult recovery involved.  Patient wants to hold off on that intervention.  Plan to do gel injections.  We will get that approved and have her follow-up in about 3 weeks.  Follow-Up Instructions: No follow-ups on file.   Orders:  Orders Placed This Encounter  Procedures  . XR Knee 1-2 Views Right  . XR KNEE 3 VIEW LEFT   No orders of the defined types were placed in this encounter.     Procedures: No procedures performed   Clinical Data: No additional findings.  Objective: Vital Signs: Ht 5\' 1"  (1.549 m)   Wt 207 lb (93.9 kg)   BMI 39.11 kg/m   Physical  Exam:   Constitutional: Patient appears well-developed HEENT:  Head: Normocephalic Eyes:EOM are normal Neck: Normal range of motion Cardiovascular: Normal rate Pulmonary/chest: Effort normal Neurologic: Patient is alert Skin: Skin is warm Psychiatric: Patient has normal mood and affect    Ortho Exam: Ortho exam demonstrates full active and passive range of motion of the ankles and hips.  Both knees actually have full extension and flexion past 90.  Extensor mechanism is intact.  Patient describes medial greater than lateral joint line tenderness bilaterally.  Pedal pulses palpable.  Slight varus alignment present bilaterally.  Specialty Comments:  No specialty comments available.  Imaging: No results found.   PMFS History: Patient Active Problem List   Diagnosis Date Noted  . Greater trochanteric bursitis of left hip 01/24/2018  . Right shoulder pain 07/24/2016  . Atypical chest pain 06/17/2016  . Osteoarthritis of right knee 04/21/2016  . Left shoulder pain 05/15/2014  . Low back pain 12/25/2013  . Right hip pain 11/29/2013  . Headache(784.0) 01/22/2013  . Hyperlipidemia LDL goal < 160 07/12/2012  . Tendinopathy of rotator cuff, right 06/26/2012  . Language barrier 12/08/2011  . Ganglion cyst 12/08/2011  . Obesity (BMI 30.0-34.9) 12/08/2011   Past Medical History:  Diagnosis Date  . GERD (gastroesophageal reflux disease)   . Knee pain   . Lower back pain     Family History  Problem Relation Age of Onset  . Sudden death Neg Hx   . Hyperlipidemia Neg Hx   . Heart attack Neg Hx   . Diabetes Neg Hx   . Hypertension Neg Hx   . Heart disease Neg Hx   . Colon cancer Neg Hx   . Colon polyps Neg Hx   . Esophageal cancer Neg Hx   . Rectal cancer Neg Hx   . Stomach cancer Neg Hx     Past Surgical History:  Procedure Laterality Date  . DIRECT LARYNGOSCOPY N/A 07/12/2018   Procedure: DIRECT LARYNGOSCOPY;  Surgeon: Newman Pies, MD;  Location: [facility] OR;  Service: ENT;   Laterality: N/A;  . knee injections    . shoulder injections     Social History   Occupational History  . Occupation: Regulatory affairs officer  Tobacco Use  . Smoking status: Never Smoker  . Smokeless tobacco: Never Used  Vaping Use  . Vaping Use: Never used  Substance and Sexual Activity  . Alcohol use: No  . Drug use: No  . Sexual activity: Yes

## 2020-07-27 NOTE — Telephone Encounter (Signed)
Noted  

## 2020-07-27 NOTE — Telephone Encounter (Signed)
Can we get approval for bilat knee gel inejctions?

## 2020-07-31 NOTE — Telephone Encounter (Signed)
Will mail patient [employer] patient assistance application for Monovisc, bilateral knee. 

## 2020-08-03 ENCOUNTER — Telehealth: Payer: Self-pay

## 2020-08-03 NOTE — Telephone Encounter (Signed)
Mailed [employer] application to patient's address listed for Monovisc, bilateral knee. 

## 2020-08-12 ENCOUNTER — Telehealth: Payer: Self-pay

## 2020-08-12 NOTE — Telephone Encounter (Signed)
Received [employer] application from patient. Faxed completed [employer] application for Monovisc, bilateral knee to [phone].

## 2020-08-31 ENCOUNTER — Telehealth: Payer: Self-pay

## 2020-08-31 NOTE — Telephone Encounter (Signed)
Received approval letter and PRF form from [employer] for Monovisc, bilateral knee.   Faxed completed PRF form to [employer] at [phone]. 

## 2020-10-06 ENCOUNTER — Telehealth: Payer: Self-pay

## 2020-10-06 NOTE — Telephone Encounter (Signed)
Called and left a VM advising patient to CB to schedule an appointment to have gel injection with Dr. August Saucer.  Approved, Monovisc, bilateral knee. Purchased through [employer] Patient Assistance

## 2020-10-14 ENCOUNTER — Other Ambulatory Visit: Payer: Self-pay

## 2020-10-14 ENCOUNTER — Encounter (HOSPITAL_COMMUNITY): Payer: Self-pay | Admitting: Emergency Medicine

## 2020-10-14 ENCOUNTER — Other Ambulatory Visit (HOSPITAL_COMMUNITY): Payer: Self-pay | Admitting: Emergency Medicine

## 2020-10-14 ENCOUNTER — Ambulatory Visit (HOSPITAL_COMMUNITY)
Admission: EM | Admit: 2020-10-14 | Discharge: 2020-10-14 | Disposition: A | Discharge status: Home / Self Care | Payer: 59 | Attending: Emergency Medicine | Admitting: Emergency Medicine

## 2020-10-14 DIAGNOSIS — Z20822 Contact with and (suspected) exposure to covid-19: Secondary | ICD-10-CM | POA: Diagnosis not present

## 2020-10-14 DIAGNOSIS — M545 Low back pain, unspecified: Secondary | ICD-10-CM | POA: Diagnosis not present

## 2020-10-14 DIAGNOSIS — R059 Cough, unspecified: Secondary | ICD-10-CM | POA: Insufficient documentation

## 2020-10-14 LAB — SARS CORONAVIRUS 2 (TAT 6-24 HRS): SARS Coronavirus 2: NEGATIVE

## 2020-10-14 MED ORDER — KETOROLAC TROMETHAMINE 30 MG/ML IJ SOLN
INTRAMUSCULAR | Status: AC
  Filled 2020-10-14: qty 1

## 2020-10-14 MED ORDER — BENZONATATE 100 MG PO CAPS: 100.0000 mg | ORAL_CAPSULE | Freq: Three times a day (TID) | ORAL | 0 refills | Status: DC | PRN

## 2020-10-14 MED ORDER — KETOROLAC TROMETHAMINE 30 MG/ML IJ SOLN
30.0000 mg | Freq: Once | INTRAMUSCULAR | Status: AC
  Administered 2020-10-14: 30 mg via INTRAMUSCULAR

## 2020-10-14 MED FILL — BENZONATATE 100 MG CAPS: 100 | 7 days supply | Qty: 21 | Fill #0

## 2020-10-14 NOTE — Discharge Instructions (Addendum)
Self isolate until covid results are back.  We will notify you by phone if it is positive. Your negative results will be sent through your MyChart.    If it is positive you need to isolate from others for a total of 5 days. If no fever for 24 hours without medications, and symptoms improving you may end isolation on day 6, but wear a mask if around any others for an additional 5 days.   Over the counter medications as needed for symptoms.  Tessalon as needed for cough.  If symptoms worsen or do not improve in the next week to return to be seen or to follow up with your PCP.

## 2020-10-14 NOTE — ED Provider Notes (Signed)
[facility]    CSN: [id number] Arrival date & time: 10/14/20  1531      History   Chief Complaint Chief Complaint  Patient presents with  . Back Pain  . Headache    HPI [name] is a 55 y.o. female.   [name] presents with complaints of cough, headache, and low back pain. Cough and headache started 6 days ago with fevers. Fevers have since resolved. Still with cough. No shortness of breath . No other nasal drainage or sore throat. No gi symptoms. Tylenol has been helpful with headache. Dayquil/nyquil has also been helpful. Her daughter was also ill but has improved. Has been vaccinated for covid-19. Low back pain is chronic. No radiation or pain, no saddle symptoms numbness or tingling distal to region of pain.    ROS per HPI, negative if not otherwise mentioned.      Past Medical History:  Diagnosis Date  . GERD (gastroesophageal reflux disease)   . Knee pain   . Lower back pain     Patient Active Problem List   Diagnosis Date Noted  . Greater trochanteric bursitis of left hip 01/24/2018  . Right shoulder pain 07/24/2016  . Atypical chest pain 06/17/2016  . Osteoarthritis of right knee 04/21/2016  . Left shoulder pain 05/15/2014  . Low back pain 12/25/2013  . Right hip pain 11/29/2013  . Headache(784.0) 01/22/2013  . Hyperlipidemia LDL goal < 160 07/12/2012  . Tendinopathy of rotator cuff, right 06/26/2012  . Language barrier 12/08/2011  . Ganglion cyst 12/08/2011  . Obesity (BMI 30.0-34.9) 12/08/2011    Past Surgical History:  Procedure Laterality Date  . DIRECT LARYNGOSCOPY N/A 07/12/2018   Procedure: DIRECT LARYNGOSCOPY;  Surgeon: Newman Pies, MD;  Location: [facility] OR;  Service: ENT;  Laterality: N/A;  . knee injections    . shoulder injections      OB History   No obstetric history on file.      Home Medications    Prior to Admission medications   Medication Sig Start Date End Date Taking? Authorizing Provider  benzonatate  (TESSALON) 100 MG capsule Take 1 capsule (100 mg total) by mouth 3 (three) times daily as needed for cough. 10/14/20  Yes Linus Mako B, NP  diclofenac (VOLTAREN) 75 MG EC tablet Take 1 tablet (75 mg total) by mouth 2 (two) times daily as needed. 04/11/19   Ralene Cork, DO  gabapentin (NEURONTIN) 300 MG capsule Take 1 capsule (300 mg total) by mouth at bedtime. 04/11/19   Ralene Cork, DO  Multiple Vitamin (MULTIVITAMIN) tablet Take 1 tablet by mouth daily. Doesn't take daily    [provider]  pantoprazole (PROTONIX) 40 MG tablet Take 40 mg by mouth daily.    [provider]  tiZANidine (ZANAFLEX) 4 MG tablet Take 1 tablet (4 mg total) by mouth daily as needed for muscle spasms. 04/25/20   Nira Conn, MD    Family History Family History  Problem Relation Age of Onset  . Sudden death Neg Hx   . Hyperlipidemia Neg Hx   . Heart attack Neg Hx   . Diabetes Neg Hx   . Hypertension Neg Hx   . Heart disease Neg Hx   . Colon cancer Neg Hx   . Colon polyps Neg Hx   . Esophageal cancer Neg Hx   . Rectal cancer Neg Hx   . Stomach cancer Neg Hx     Social History Social History   Tobacco  Use  . Smoking status: Never Smoker  . Smokeless tobacco: Never Used  Vaping Use  . Vaping Use: Never used  Substance Use Topics  . Alcohol use: No  . Drug use: No     Allergies   Patient has no known allergies.   Review of Systems Review of Systems   Physical Exam Triage Vital Signs ED Triage Vitals  Enc Vitals Group     BP 10/14/20 1625 123/88     Pulse Rate 10/14/20 1625 78     Resp 10/14/20 1625 16     Temp 10/14/20 1625 98.1 F (36.7 C)     Temp Source 10/14/20 1625 Oral     SpO2 10/14/20 1625 99 %     Weight --      Height --      Head Circumference --      Peak Flow --      Pain Score 10/14/20 1624 0     Pain Loc --      Pain Edu? --      Excl. in GC? --    No data found.  Updated Vital Signs BP 123/88 (BP Location: Left Arm)    Pulse 78   Temp 98.1 F (36.7 C) (Oral)   Resp 16   SpO2 99%   Visual Acuity Right Eye Distance:   Left Eye Distance:   Bilateral Distance:    Right Eye Near:   Left Eye Near:    Bilateral Near:     Physical Exam Constitutional:      General: She is not in acute distress.    Appearance: She is well-developed.  Cardiovascular:     Rate and Rhythm: Normal rate.  Pulmonary:     Effort: Pulmonary effort is normal.     Breath sounds: Normal breath sounds.  Musculoskeletal:     Lumbar back: Tenderness present. No bony tenderness. Negative right straight leg raise test and negative left straight leg raise test.     Comments: Bilateral low back musculature with tenderness without spinous process tenderness; ambulatory without difficulty   Skin:    General: Skin is warm and dry.  Neurological:     Mental Status: She is alert and oriented to person, place, and time.      UC Treatments / Results  Labs (all labs ordered are listed, but only abnormal results are displayed) Labs Reviewed  SARS CORONAVIRUS 2 (TAT 6-24 HRS)    EKG   Radiology No results found.  Procedures Procedures (including critical care time)  Medications Ordered in UC Medications  ketorolac (TORADOL) 30 MG/ML injection 30 mg (has no administration in time range)    Initial Impression / Assessment and Plan / UC Course  I have reviewed the triage vital signs and the nursing notes.  Pertinent labs & imaging results that were available during my care of the patient were reviewed by me and considered in my medical decision making (see chart for details).     She feels cough is overall improving. Non toxic. Benign physical exam.  History and physical consistent with viral illness.  Covid testing pending and isolation instructions provided.  Supportive cares recommended. Back pain management discussed. Return precautions provided. Patient verbalized understanding and agreeable to plan.   Final Clinical  Impressions(s) / UC Diagnoses   Final diagnoses:  Cough  Bilateral low back pain without sciatica, unspecified chronicity     Discharge Instructions     Self isolate until covid results are back.  We will notify you by phone if it is positive. Your negative results will be sent through your MyChart.    If it is positive you need to isolate from others for a total of 5 days. If no fever for 24 hours without medications, and symptoms improving you may end isolation on day 6, but wear a mask if around any others for an additional 5 days.   Over the counter medications as needed for symptoms.  Tessalon as needed for cough.  If symptoms worsen or do not improve in the next week to return to be seen or to follow up with your PCP.     ED Prescriptions    Medication Sig Dispense Auth. Provider   benzonatate (TESSALON) 100 MG capsule Take 1 capsule (100 mg total) by mouth 3 (three) times daily as needed for cough. 21 capsule Georgetta Haber, NP     PDMP not reviewed this encounter.   Georgetta Haber, NP 10/14/20 1656

## 2020-10-14 NOTE — ED Triage Notes (Signed)
Pt presents with headache and cough xs 1 week. States also has back pain that comes and goes. Last dose of tylenol at 1300.

## 2020-10-27 ENCOUNTER — Other Ambulatory Visit: Payer: Self-pay

## 2020-10-27 ENCOUNTER — Ambulatory Visit
Admission: RE | Admit: 2020-10-27 | Discharge: 2020-10-27 | Disposition: A | Discharge status: Home / Self Care | Payer: 59 | Source: Ambulatory Visit | Attending: Sports Medicine | Admitting: Sports Medicine

## 2020-10-27 ENCOUNTER — Ambulatory Visit (INDEPENDENT_AMBULATORY_CARE_PROVIDER_SITE_OTHER): Payer: 59 | Admitting: Sports Medicine

## 2020-10-27 ENCOUNTER — Other Ambulatory Visit: Payer: Self-pay | Admitting: Sports Medicine

## 2020-10-27 VITALS — BP 130/89 | Ht 61.0 in

## 2020-10-27 DIAGNOSIS — G8929 Other chronic pain: Secondary | ICD-10-CM

## 2020-10-27 DIAGNOSIS — M545 Low back pain, unspecified: Secondary | ICD-10-CM | POA: Diagnosis not present

## 2020-10-27 MED ORDER — DICLOFENAC SODIUM 75 MG PO TBEC: 75.0000 mg | DELAYED_RELEASE_TABLET | Freq: Two times a day (BID) | ORAL | 0 refills | Status: DC | PRN

## 2020-10-27 MED FILL — DICLOFENAC SOD EC 75 MG TAB: 75 | 30 days supply | Qty: 60 | Fill #0

## 2020-10-27 NOTE — Progress Notes (Signed)
   Subjective:    Patient ID: [name], female    DOB: 1966-01-15, 55 y.o.   MRN: [id number]  HPI chief complaint: Low back pain  Patient comes in today with returning low back pain.  This is a chronic issue for her.  Pain is diffuse across her low back and is worse with standing.  It does not radiate into her legs.  No recent trauma.  No recent imaging.  She was referred to physical therapy previously for this issue but she did not attend.  She takes ibuprofen occasionally which is somewhat helpful.  She has taken Voltaren in the past with Mcfarland results.  Please note that the history is obtained with the help of an interpreter.  Interim medical history reviewed Medications reviewed Allergies reviewed    Review of Systems As above    Objective:   Physical Exam  Well-developed, well-nourished.  No acute distress  Lumbar spine: Patient is diffusely tender to palpation along the lumbar spine area.  Full range of motion but pain with extension.  Negative straight leg raise.  No gross focal neurological deficit of either lower extremity.      Assessment & Plan:   Chronic low back pain likely secondary to facet arthropathy  X-rays of the lumbar spine including AP and lateral views.  Voltaren 75 mg twice daily with food as needed.  She will discontinue all other NSAIDs when taking Voltaren.  I would like to reorder physical therapy and she will follow up with me again in 3 to 4 weeks.  We may need to consider further diagnostic imaging in the form of an MRI in anticipation of facet injections if her pain does not improve.  Call with questions or concerns in the interim.

## 2020-10-27 NOTE — Patient Instructions (Signed)
Dr. Kern [facility] [address] [place], [place] [address] [phone]  Please do not take ibuprofen, advil or aleve with the Voltaren that we prescribed today. Follow up with [place] in 3-4 weeks.

## 2020-10-29 ENCOUNTER — Ambulatory Visit: Payer: 59 | Admitting: Sports Medicine

## 2020-11-24 ENCOUNTER — Ambulatory Visit (INDEPENDENT_AMBULATORY_CARE_PROVIDER_SITE_OTHER): Payer: 59 | Admitting: Sports Medicine

## 2020-11-24 ENCOUNTER — Other Ambulatory Visit: Payer: Self-pay

## 2020-11-24 VITALS — BP 130/81 | Ht 61.0 in | Wt 200.0 lb

## 2020-11-24 DIAGNOSIS — M545 Low back pain, unspecified: Secondary | ICD-10-CM

## 2020-11-24 DIAGNOSIS — G8929 Other chronic pain: Secondary | ICD-10-CM

## 2020-11-24 NOTE — Progress Notes (Signed)
Patient ID: [name], female   DOB: [birth year], 55 y.o.   MRN: [id number]  Patient returns today for follow-up on low back pain.  Recent x-rays of her lumbar spine shows facet arthropathy in the lower lumbar area.  She continues to have pain, especially with standing.  Voltaren was not helpful.  She has yet to start physical therapy which is likely due to her language barrier.  She has a trip planned for March 23 and she will be out of the country for a couple of months.  She would like to get as aggressive as possible to get her pain under control before she leaves on her trip.  I think she would benefit greatly from facet injections.  Therefore, we will order a preprocedural MRI of her lumbar spine.  We will also try to schedule her first physical therapy appointment while she is here with [place] in the office today.  She is requesting that I call her son with the MRI results when available [phone]).

## 2020-12-01 NOTE — Addendum Note (Signed)
Addended by: Rutha Bouchard E on: 12/01/2020 04:40 PM   Modules accepted: Orders

## 2020-12-10 ENCOUNTER — Ambulatory Visit: Payer: 59 | Attending: Sports Medicine | Admitting: Physical Therapy

## 2020-12-10 ENCOUNTER — Other Ambulatory Visit: Payer: Self-pay

## 2020-12-10 ENCOUNTER — Encounter: Payer: Self-pay | Admitting: Physical Therapy

## 2020-12-10 DIAGNOSIS — M545 Low back pain, unspecified: Secondary | ICD-10-CM | POA: Insufficient documentation

## 2020-12-10 DIAGNOSIS — G8929 Other chronic pain: Secondary | ICD-10-CM | POA: Insufficient documentation

## 2020-12-10 DIAGNOSIS — R262 Difficulty in walking, not elsewhere classified: Secondary | ICD-10-CM | POA: Insufficient documentation

## 2020-12-10 NOTE — Patient Instructions (Signed)
Access Code: [email or web address]   URL: [email or web address]: 03/10/2022Prepared by: Victorino Dike PaaExercises  Supine Posterior Pelvic Tilt - 2 x daily - 7 x weekly - 2 sets - 10 reps - 10 hold  Supine Lower Trunk Rotation - 2 x daily - 7 x weekly - 2 sets - 10 reps - 10 hold  Supine Hamstring Stretch - 2 x daily - 7 x weekly - 2 sets - 5 reps - 30 hold  Child's Pose Stretch - 2 x daily - 7 x weekly - 1 sets - 3 reps - 30 hold

## 2020-12-10 NOTE — Therapy (Signed)
[facility] [address] [place], [place], [address] Phone: [phone]   Fax:  [phone]  Physical Therapy Evaluation  Patient Details  Name: [name] MRN: [id number] Date of Birth: 1966/02/08 Referring Provider (PT): Dr. Margaretha Sheffield   Encounter Date: 12/10/2020   PT End of Session - 12/10/20 0934    Visit Number 1    Number of Visits 3    Date for PT Re-Evaluation 12/23/20    PT Start Time 0830    PT Stop Time 0913    PT Time Calculation (min) 43 min    Activity Tolerance Patient tolerated treatment well    Behavior During Therapy [facility] for tasks assessed/performed           Past Medical History:  Diagnosis Date  . GERD (gastroesophageal reflux disease)   . Knee pain   . Lower back pain     Past Surgical History:  Procedure Laterality Date  . DIRECT LARYNGOSCOPY N/A 07/12/2018   Procedure: DIRECT LARYNGOSCOPY;  Surgeon: Newman Pies, MD;  Location: [facility] OR;  Service: ENT;  Laterality: N/A;  . knee injections    . shoulder injections      There were no vitals filed for this visit.    Subjective Assessment - 12/10/20 0839    Subjective Patient presents with chronic back pain for > 20 yrs  but worsening in the past 2-3 mos.  She has low back pain but denies weakness and sensory changes.  She has difficulty with walking, standing, getting up from a chair. This makes housework and mobility difficult  The doctor wants me to do PT prior to injections.    Patient is accompained by: Interpreter    Pertinent History bilateral knee pain    Limitations House hold activities;Standing;Walking    Diagnostic tests MRI ordered, XR: Mild degenerative changes of the lumbar spine. No acute compression    Patient Stated Goals Patient would ,like to see if she can have less pain .    Currently in Pain? Yes    Pain Score 8     Pain Location Back    Pain Orientation Lower    Pain Descriptors / Indicators Aching;Sore    Pain Type Chronic  pain    Pain Radiating Towards L>R    Pain Onset More than a month ago    Pain Frequency Constant    Aggravating Factors  standing, walking    Pain Relieving Factors leaning forward, when kids massage her, laying down    Effect of Pain on Daily Activities limits her mobility    Multiple Pain Sites No              [facility] PT Assessment - 12/10/20 0001      Assessment   Medical Diagnosis lumbar facet arthropathy    Referring Provider (PT) Dr. Margaretha Sheffield    Onset Date/Surgical Date --   chronic   Next MD Visit after PT, MRI    Prior Therapy No      Precautions   Precautions None      Restrictions   Weight Bearing Restrictions No      Balance Screen   Has the patient fallen in the past 6 months No      Home Environment   Living Environment Private residence    Living Arrangements Spouse/significant other;Children    Type of Home House    Home Access Level entry    Home Layout Able to live on main level with bedroom/bathroom  Additional Comments 5 kids      Prior Function   Level of Independence Independent with basic ADLs    Vocation Full time employment    Vocation Requirements [employer], stocks shelves, lifts boxes    Leisure tries to relax at home      Cognition   Overall Cognitive Status Within Functional Limits for tasks assessed      Observation/Other Assessments   Focus on Therapeutic Outcomes (FOTO)  NT due to language      Sensation   Light Touch Appears Intact      Coordination   Gross Motor Movements are Fluid and Coordinated Not tested      Functional Tests   Functional tests Single leg stance;Squat      Squat   Comments hip dominant      Single Leg Stance   Comments limited < 10 sec      Posture/Postural Control   Posture/Postural Control Postural limitations    Postural Limitations Rounded Shoulders;Forward head;Increased lumbar lordosis    Posture Comments genu varus      AROM   Lumbar Flexion WFL    Lumbar Extension WFL min pain     Lumbar - Right Side Bend 25% pain L    Lumbar - Left Side Bend 25% pain L    Lumbar - Right Rotation WFl with pain    Lumbar - Left Rotation WFL with pain      Strength   Overall Strength Comments WFL in sitting (hip flex, knees), abduction 4+/5      Palpation   Spinal mobility stiffness in lumbar spine, pain L3-L4-L5 and along lateral SIJ    Palpation comment sore, painful but well tolerated      Transfers   Five time sit to stand comments  23 sec, wide BOS      Ambulation/Gait   Ambulation Distance (Feet) 150 Feet    Gait Pattern Antalgic    Ambulation Surface Level;Indoor                      Objective measurements completed on examination: See above findings.               PT Education - 12/10/20 0933    Education Details PT/POC, HEP, basic back    Person(s) Educated Patient    Methods Explanation;Handout;Demonstration;Verbal cues    Comprehension Verbalized understanding;Returned demonstration;Need further instruction            PT Short Term Goals - 12/10/20 0934      PT SHORT TERM GOAL #1   Title Patient will be I with HEP for basic back and lifting mechanics    Time 3    Period Weeks    Status New    Target Date 12/23/20                     Plan - 12/10/20 0933    Clinical Impression Statement Patient presents with longstanding back pain worsening past 3 mos.  She has decent strength despite knee and back pain.  She has spinal stffness, hip stiffness and weakness in core.  She works for [employer] and [employer].  Will give her a basic HEP as well as info on safe lifting, body mechanics to instill safety as she continues to work in and out of her home. She will only be able to attend a few visits but may return to PT after her trip home  to [place].    Personal Factors and Comorbidities Comorbidity 1;Time since onset of injury/illness/exacerbation    Comorbidities obesity, knee pain     Examination-Activity Limitations Lift;Squat;Locomotion Level;Stand    Examination-Participation Restrictions Cleaning;Laundry;Community Activity;Shop;Occupation    Stability/Clinical Decision Making Stable/Uncomplicated    Clinical Decision Making Low    Rehab Potential Good    PT Frequency 1x / week    PT Duration 3 weeks    PT Treatment/Interventions ADLs/Self Care Home Management;Therapeutic exercise;Patient/family education;Moist Heat    PT Next Visit Plan review HEp, leaving [place] for 2 mos after 3/23. Demo, assess lifting    PT Home Exercise Plan [phone]  basic back    Consulted and Agree with Plan of Care Patient           Patient will benefit from skilled therapeutic intervention in order to improve the following deficits and impairments:  Decreased mobility,Difficulty walking,Hypomobility,Obesity,Pain,Impaired flexibility,Increased fascial restricitons,Postural dysfunction,Decreased strength,Decreased range of motion  Visit Diagnosis: Chronic bilateral low back pain without sciatica  Difficulty in walking, not elsewhere classified     Problem List Patient Active Problem List   Diagnosis Date Noted  . Greater trochanteric bursitis of left hip 01/24/2018  . Right shoulder pain 07/24/2016  . Atypical chest pain 06/17/2016  . Osteoarthritis of right knee 04/21/2016  . Left shoulder pain 05/15/2014  . Low back pain 12/25/2013  . Right hip pain 11/29/2013  . Headache(784.0) 01/22/2013  . Hyperlipidemia LDL goal < 160 07/12/2012  . Tendinopathy of rotator cuff, right 06/26/2012  . Language barrier 12/08/2011  . Ganglion cyst 12/08/2011  . Obesity (BMI 30.0-34.9) 12/08/2011    Danahi Reddish 12/10/2020, 9:41 AM  [facility] [address] [place], [place], [address] Phone: [phone]   Fax:  [phone]  Name: Jae Bruck MRN: [id number] Date of Birth: 18-May-1966   Karie Mainland, PT 12/10/20 9:41 AM Phone:  [phone] Fax: [phone]

## 2020-12-12 ENCOUNTER — Ambulatory Visit (INDEPENDENT_AMBULATORY_CARE_PROVIDER_SITE_OTHER): Payer: 59

## 2020-12-12 ENCOUNTER — Other Ambulatory Visit: Payer: Self-pay

## 2020-12-12 DIAGNOSIS — M545 Low back pain, unspecified: Secondary | ICD-10-CM | POA: Diagnosis not present

## 2020-12-12 DIAGNOSIS — M47819 Spondylosis without myelopathy or radiculopathy, site unspecified: Secondary | ICD-10-CM

## 2020-12-12 DIAGNOSIS — G8929 Other chronic pain: Secondary | ICD-10-CM

## 2020-12-14 ENCOUNTER — Other Ambulatory Visit: Payer: 59

## 2020-12-14 ENCOUNTER — Telehealth: Payer: Self-pay

## 2020-12-14 DIAGNOSIS — G8929 Other chronic pain: Secondary | ICD-10-CM

## 2020-12-14 DIAGNOSIS — M545 Low back pain, unspecified: Secondary | ICD-10-CM

## 2020-12-14 NOTE — Telephone Encounter (Signed)
Pt and her daughter understand and agree with the plan. [facility] will contact them to schedule an appt with Dr. Alvester Morin.

## 2020-12-15 ENCOUNTER — Ambulatory Visit: Payer: 59 | Admitting: Physical Therapy

## 2020-12-15 ENCOUNTER — Telehealth: Payer: Self-pay | Admitting: Sports Medicine

## 2020-12-15 NOTE — Telephone Encounter (Signed)
  Patient and her daughter were notified yesterday of the MRI results of [name]'s lumbar spine.  As suspected, she has multilevel spondylosis of the lumbar spine greatest at L4-L5.  She has concomitant facet arthropathy.  I believe her pain is facet mediated and she would benefit from diagnostic/therapeutic facet injections.  Unfortunately, her insurance will not allow me to refer her directly to interventional radiology so I will instead refer her to physiatry at [facility].  I will defer further work-up and treatment to the discretion of physiatry.  I do not think she has pathology or symptomatology significant enough to consider surgery.  Follow-up with me as needed.

## 2020-12-22 ENCOUNTER — Other Ambulatory Visit: Payer: Self-pay

## 2020-12-22 ENCOUNTER — Ambulatory Visit (INDEPENDENT_AMBULATORY_CARE_PROVIDER_SITE_OTHER): Payer: 59 | Admitting: Family Medicine

## 2020-12-22 ENCOUNTER — Other Ambulatory Visit: Payer: Self-pay | Admitting: Family Medicine

## 2020-12-22 DIAGNOSIS — M1711 Unilateral primary osteoarthritis, right knee: Secondary | ICD-10-CM

## 2020-12-22 DIAGNOSIS — M1712 Unilateral primary osteoarthritis, left knee: Secondary | ICD-10-CM | POA: Diagnosis not present

## 2020-12-22 DIAGNOSIS — M542 Cervicalgia: Secondary | ICD-10-CM | POA: Diagnosis not present

## 2020-12-22 MED ORDER — TIZANIDINE HCL 4 MG PO TABS: 4.0000 mg | ORAL_TABLET | Freq: Every day | ORAL | 6 refills | Status: DC | PRN

## 2020-12-22 MED ORDER — DICLOFENAC SODIUM 75 MG PO TBEC: 75.0000 mg | DELAYED_RELEASE_TABLET | Freq: Two times a day (BID) | ORAL | 3 refills | Status: DC | PRN

## 2020-12-22 MED FILL — tiZANidine HCL 4 MG TABS: 4 | 30 days supply | Qty: 30 | Fill #0

## 2020-12-22 MED FILL — DICLOFENAC SOD EC 75 MG TAB: 75 | 30 days supply | Qty: 60 | Fill #0

## 2020-12-22 NOTE — Progress Notes (Signed)
Office Visit Note   Patient: [name]           Date of Birth: 09-Apr-1966           MRN: [id number] Visit Date: 12/22/2020 Requested by: Garnette Gunner, MD [address] [place],  [place] [address] PCP: Garnette Gunner, MD  Subjective: Chief Complaint  Patient presents with   Right Knee - Pain   Left Knee - Pain    Pain in both knees, left more than right. Is leaving the country tomorrow, until May 11th or so. Requesting cortisone injections in the knees.     HPI: 55yo F presenting to clinic for bilateral Monovisc injections. Patient states she is leaving for [place] tomorrow, and is hoping injections will keep her comfortable during her trip. She states her left knee hurts worse than her right, but she would like to get both of them done if possible. She has no acute concerns today.               ROS:   All other systems were reviewed and are negative.  Objective: Vital Signs: There were no vitals taken for this visit.  Physical Exam:  General:  Alert and oriented, in no acute distress. Pulm:  Breathing unlabored. Psy:  Normal mood, congruent affect. Skin:  Bilateral knees overlying skin intact. No bruising, no rashes, no erythema.    Bilateral Knee Exam:  General: Antalgic gait, with stiff knees bilaterally.  Standing exam: No varus or valgus deformity of the knee.   Seated Exam:  Significant patellar crepitus bilaterally.    Palpation: Tenderness to palpation over both medial and lateral joint lines, left worse than right. Tenderness over patellar facets bilaterally.   Supine exam: Trace effusion, normal patellar mobility.   Ligamentous Exam:  No pain or laxity with anterior/posterior drawer.  No obvious Sag.  No pain, though with faint laxity with valgus stress across the knee.   Meniscus:  McMurray with no pain or deep clicking.   Strength: Hip flexion (L1), Hip Aduction (L2), Knee Extension (L3) are 5/5 Bilaterally Foot Inversion (L4), Dorsiflexion (L5),  and Eversion (S1) 5/5 Bilaterally  Sensation: Intact to light touch medial and lateral aspects of lower extremities, and lateral, dorsal, and medial aspects of foot.    Imaging: No results found.  Assessment & Plan: 55yo F presenting to clinic for bilateral monovisc injections prior to her trip to the [facility]. Risks and benefits discussed, and patient opted to proceed. Injection therapy performed as described below, which patient tolerated very well.  - Aftercare and return precautions discussed. - Patient and her daughter had no further concerns today.      Procedures: Bilateral knee Monovisc Injection:  Risks and benefits of procedure discussed, Patient opted to proceed. verbal Consent obtained.  Timeout performed.  Skin prepped in a sterile fashion with betadine before further cleansing with alcohol. Ethyl Chloride was used for topical analgesia.  Right knee was injected with 3cc 0.25% Bupivacaine without epinephrine via the midpatellar approach using a 25G, 1.5in needle. Syringe was removed from the needle, and monovisc product was injected into the joint.  Procedure was then repeated on the left knee.  Patient tolerated the injection well with no immediate complications. Aftercare instructions were discussed, and patient was given strict return precautions.     PMFS History: Patient Active Problem List   Diagnosis Date Noted   Greater trochanteric bursitis of left hip 01/24/2018   Right shoulder pain 07/24/2016  Atypical chest pain 06/17/2016   Osteoarthritis of right knee 04/21/2016   Left shoulder pain 05/15/2014   Low back pain 12/25/2013   Right hip pain 11/29/2013   Headache(784.0) 01/22/2013   Hyperlipidemia LDL goal < 160 07/12/2012   Tendinopathy of rotator cuff, right 06/26/2012   Language barrier 12/08/2011   Ganglion cyst 12/08/2011   Obesity (BMI 30.0-34.9) 12/08/2011   Past Medical History:  Diagnosis Date   GERD (gastroesophageal  reflux disease)    Knee pain    Lower back pain     Family History  Problem Relation Age of Onset   Sudden death Neg Hx    Hyperlipidemia Neg Hx    Heart attack Neg Hx    Diabetes Neg Hx    Hypertension Neg Hx    Heart disease Neg Hx    Colon cancer Neg Hx    Colon polyps Neg Hx    Esophageal cancer Neg Hx    Rectal cancer Neg Hx    Stomach cancer Neg Hx     Past Surgical History:  Procedure Laterality Date   DIRECT LARYNGOSCOPY N/A 07/12/2018   Procedure: DIRECT LARYNGOSCOPY;  Surgeon: Newman Pies, MD;  Location: [facility] OR;  Service: ENT;  Laterality: N/A;   knee injections     shoulder injections     Social History   Occupational History   Occupation: Regulatory affairs officer  Tobacco Use   Smoking status: Never Smoker   Smokeless tobacco: Never Used  Building services engineer Use: Never used  Substance and Sexual Activity   Alcohol use: No   Drug use: No   Sexual activity: Yes

## 2020-12-22 NOTE — Progress Notes (Signed)
I saw and examined the patient with Dr. Marga Hoots and agree with assessment and plan as outlined.    Bilateral knee pain with known DJD.  Will inject each knee with Monovisc today.

## 2021-02-15 ENCOUNTER — Ambulatory Visit: Payer: 59 | Admitting: Physical Medicine and Rehabilitation

## 2021-03-03 ENCOUNTER — Telehealth: Payer: Self-pay

## 2021-03-03 NOTE — Telephone Encounter (Signed)
Patients daughter called she wants to r/s appointment that the patient missed she is requesting a call back:[phone]

## 2021-03-03 NOTE — Telephone Encounter (Signed)
Called pt and LVM #1 

## 2021-03-09 NOTE — Telephone Encounter (Signed)
Called pt and LVM #2 

## 2021-03-25 ENCOUNTER — Other Ambulatory Visit: Payer: Self-pay

## 2021-03-25 ENCOUNTER — Encounter: Payer: Self-pay | Admitting: Physical Medicine and Rehabilitation

## 2021-03-25 ENCOUNTER — Ambulatory Visit: Payer: Self-pay

## 2021-03-25 ENCOUNTER — Ambulatory Visit (INDEPENDENT_AMBULATORY_CARE_PROVIDER_SITE_OTHER): Payer: 59 | Admitting: Physical Medicine and Rehabilitation

## 2021-03-25 VITALS — BP 127/82 | HR 81

## 2021-03-25 DIAGNOSIS — M47816 Spondylosis without myelopathy or radiculopathy, lumbar region: Secondary | ICD-10-CM

## 2021-03-25 MED ORDER — BETAMETHASONE SOD PHOS & ACET 6 (3-3) MG/ML IJ SUSP
12.0000 mg | Freq: Once | INTRAMUSCULAR | Status: AC
  Administered 2021-03-25: 12 mg

## 2021-03-25 NOTE — Progress Notes (Signed)
Pt state lower back pain that travels down both knees. Pt state standing makes the pain worse. Pt state she takes over the counter pain meds to help ease her pain.  Numeric Pain Rating Scale and Functional Assessment Average Pain 10   In the last MONTH (on 0-10 scale) has pain interfered with the following?  1. General activity like being  able to carry out your everyday physical activities such as walking, climbing stairs, carrying groceries, or moving a chair?  Rating(10)   +Driver, -BT, -Dye Allergies.

## 2021-03-25 NOTE — Patient Instructions (Signed)

## 2021-03-28 NOTE — Procedures (Signed)
Lumbar Facet Joint Intra-Articular Injection(s) with Fluoroscopic Guidance  Patient: [name]      Date of Birth: August 22, 1966 MRN: [id number] PCP: Garnette Gunner, MD      Visit Date: 03/25/2021   Universal Protocol:    Date/Time: 03/25/2021  Consent Given By: the patient  Position: PRONE   Additional Comments: Vital signs were monitored before and after the procedure. Patient was prepped and draped in the usual sterile fashion. The correct patient, procedure, and site was verified.   Injection Procedure Details:  Procedure Site One Meds Administered:  Meds ordered this encounter  Medications   betamethasone acetate-betamethasone sodium phosphate (CELESTONE) injection 12 mg     Laterality: Bilateral  Location/Site:  L4-L5  Needle size: 22 guage  Needle type: Spinal  Needle Placement: Articular  Findings:  -Comments: Excellent flow of contrast producing a partial arthrogram.  Procedure Details: The fluoroscope beam is vertically oriented in AP, and the inferior recess is visualized beneath the lower pole of the inferior apophyseal process, which represents the target point for needle insertion. When direct visualization is difficult the target point is located at the medial projection of the vertebral pedicle. The region overlying each aforementioned target is locally anesthetized with a 1 to 2 ml. volume of 1% Lidocaine without Epinephrine.   The spinal needle was inserted into each of the above mentioned facet joints using biplanar fluoroscopic guidance. A 0.25 to 0.5 ml. volume of Isovue-250 was injected and a partial facet joint arthrogram was obtained. A single spot film was obtained of the resulting arthrogram.    One to 1.25 ml of the steroid/anesthetic solution was then injected into each of the facet joints noted above.   Additional Comments:  The patient tolerated the procedure well Dressing: 2 x 2 sterile gauze and Band-Aid    Post-procedure  details: Patient was observed during the procedure. Post-procedure instructions were reviewed.  Patient left the clinic in stable condition.

## 2021-03-28 NOTE — Progress Notes (Signed)
[name] - 55 y.o. female MRN [id number]  Date of birth: 09-19-1966  Office Visit Note: Visit Date: 03/25/2021 PCP: Garnette Gunner, MD Referred by: Garnette Gunner, MD  Subjective: Chief Complaint  Patient presents with   Lower Back - Pain   Left Knee - Pain   Right Knee - Pain   HPI:  [name] is a 55 y.o. female who comes in today At the request of Dr. Reino Bellis for evaluation and management and likely facet joint injection at L4-5.  Patient has an interpreter today.  Referral was originally placed many months ago but [place] was [place].  She also sees Dr. Prince Rome in our office for her knees.  She reports that the injections have not been very helpful.  I did explain to her the MRI of her lumbar spine showing pretty severe facet arthropathy at L4-5.  There is a small cyst and signal intensity at this level.  It does fit with her back pain which is mostly back pain with standing and going from sit to stand without referral down the legs.  MRI does not show any stenosis or disc herniation.  She does want to proceed with injection today.  She has had physical therapy and medication management.  Potentially could look at medial branch blocks depending on how the relief goes with the facet joint block.  Could look at radiofrequency ablation.  I did not explain that to her today other than just briefly.  ROS Otherwise per HPI.  Assessment & Plan: Visit Diagnoses:    ICD-10-CM   1. Spondylosis without myelopathy or radiculopathy, lumbar region  M47.816 XR C-ARM NO REPORT    Facet Injection    betamethasone acetate-betamethasone sodium phosphate (CELESTONE) injection 12 mg      Plan: No additional findings.   Meds & Orders:  Meds ordered this encounter  Medications   betamethasone acetate-betamethasone sodium phosphate (CELESTONE) injection 12 mg    Orders Placed This Encounter  Procedures   Facet Injection   XR C-ARM NO REPORT    Follow-up: Return  if symptoms worsen or fail to improve.   Procedures: No procedures performed  Lumbar Facet Joint Intra-Articular Injection(s) with Fluoroscopic Guidance  Patient: [name]      Date of Birth: [birth year] MRN: [id number] PCP: Garnette Gunner, MD      Visit Date: 03/25/2021   Universal Protocol:    Date/Time: 03/25/2021  Consent Given By: the patient  Position: PRONE   Additional Comments: Vital signs were monitored before and after the procedure. Patient was prepped and draped in the usual sterile fashion. The correct patient, procedure, and site was verified.   Injection Procedure Details:  Procedure Site One Meds Administered:  Meds ordered this encounter  Medications   betamethasone acetate-betamethasone sodium phosphate (CELESTONE) injection 12 mg     Laterality: Bilateral  Location/Site:  L4-L5  Needle size: 22 guage  Needle type: Spinal  Needle Placement: Articular  Findings:  -Comments: Excellent flow of contrast producing a partial arthrogram.  Procedure Details: The fluoroscope beam is vertically oriented in AP, and the inferior recess is visualized beneath the lower pole of the inferior apophyseal process, which represents the target point for needle insertion. When direct visualization is difficult the target point is located at the medial projection of the vertebral pedicle. The region overlying each aforementioned target is locally anesthetized with a 1 to 2 ml. volume of 1% Lidocaine without Epinephrine.  The spinal needle was inserted into each of the above mentioned facet joints using biplanar fluoroscopic guidance. A 0.25 to 0.5 ml. volume of Isovue-250 was injected and a partial facet joint arthrogram was obtained. A single spot film was obtained of the resulting arthrogram.    One to 1.25 ml of the steroid/anesthetic solution was then injected into each of the facet joints noted above.   Additional Comments:  The patient tolerated the  procedure well Dressing: 2 x 2 sterile gauze and Band-Aid    Post-procedure details: Patient was observed during the procedure. Post-procedure instructions were reviewed.  Patient left the clinic in stable condition.    Clinical History: MRI LUMBAR SPINE WITHOUT CONTRAST   TECHNIQUE: Multiplanar, multisequence MR imaging of the lumbar spine was performed. No intravenous contrast was administered.   COMPARISON:  Spine radiographs 10/27/20   FINDINGS: Segmentation: 5 non rib-bearing lumbar type vertebral bodies are present. The lowest fully formed vertebral body is L5.   Alignment: Slight anterolisthesis is present at L4-5. No other significant listhesis or scoliosis is present.   Vertebrae: Mild chronic endplate marrow changes present at L3-4. Edematous changes are present about the facet joints at L4-5 bilaterally, left greater than right. Subchondral cysts are present about the joint at L4-5 on the left. Marrow signal and vertebral body heights are otherwise normal.   Conus medullaris and cauda equina: Conus extends to the L2 level. Conus and cauda equina appear normal.   Paraspinal and other soft tissues: Cystic changes are present in the kidneys bilaterally. No solid lesions are present. No significant adenopathy is present.   Disc levels:   T12-L1: Negative.   L1-2: Mild disc bulging facet hypertrophy is present without significant stenosis.   L2-3: Mild disc bulging and facet hypertrophy is present. No significant stenosis is present.   L3-4: Mild disc bulging and facet hypertrophy is present. In the setting of short pedicles this results in mild foraminal narrowing bilaterally.   L4-5: There is uncovering of a broad-based disc protrusion. Moderate facet hypertrophy is noted bilaterally. This results in mild subarticular narrowing bilaterally. Moderate foraminal stenosis is worse left than right.   L5-S1: Mild facet hypertrophy is present bilaterally. No  significant stenosis is present.   IMPRESSION: 1. Multilevel spondylosis of the lumbar spine is greatest at L4-5 where there is grade 1 anterolisthesis, uncovering of a broad-based disc protrusion, and moderate facet hypertrophy. 2. Mild subarticular narrowing bilaterally at L4-5. 3. Moderate foraminal stenosis bilaterally at L4-5 is worse on the left. 4. Mild foraminal narrowing bilaterally at L3-4 secondary to disc bulging, facet hypertrophy, and a congenital element of short pedicles. 5. Mild facet hypertrophy bilaterally at L5-S1 without significant stenosis.     Electronically Signed   By: Marin Roberts M.D.   On: 12/13/2020 15:26     Objective:  VS:  HT:    WT:   BMI:     BP:127/82  HR:81bpm  TEMP: ( )  RESP:  Physical Exam Vitals and nursing note reviewed.  Constitutional:      General: She is not in acute distress.    Appearance: Normal appearance. She is obese. She is not ill-appearing.  HENT:     Head: Normocephalic and atraumatic.     Right Ear: External ear normal.     Left Ear: External ear normal.  Eyes:     Extraocular Movements: Extraocular movements intact.  Cardiovascular:     Rate and Rhythm: Normal rate.     Pulses: Normal pulses.  Pulmonary:     Effort: Pulmonary effort is normal. No respiratory distress.  Abdominal:     General: There is no distension.     Palpations: Abdomen is soft.  Musculoskeletal:        General: Tenderness present.     Cervical back: Neck supple.     Right lower leg: No edema.     Left lower leg: No edema.     Comments: Patient has good distal strength with no pain over the greater trochanters.  No clonus or focal weakness. Patient somewhat slow to rise from a seated position to full extension.  There is concordant low back pain with facet loading and lumbar spine extension rotation.  There are no definitive trigger points but the patient is somewhat tender across the lower back and PSIS.  There is no pain with  hip rotation.   Skin:    Findings: No erythema, lesion or rash.  Neurological:     General: No focal deficit present.     Mental Status: She is alert and oriented to person, place, and time.     Sensory: No sensory deficit.     Motor: No weakness or abnormal muscle tone.     Coordination: Coordination normal.  Psychiatric:        Mood and Affect: Mood normal.        Behavior: Behavior normal.     Imaging: No results found.

## 2021-04-06 ENCOUNTER — Ambulatory Visit (INDEPENDENT_AMBULATORY_CARE_PROVIDER_SITE_OTHER): Payer: 59 | Admitting: Sports Medicine

## 2021-04-06 ENCOUNTER — Other Ambulatory Visit (HOSPITAL_COMMUNITY): Payer: Self-pay

## 2021-04-06 ENCOUNTER — Other Ambulatory Visit: Payer: Self-pay

## 2021-04-06 DIAGNOSIS — M170 Bilateral primary osteoarthritis of knee: Secondary | ICD-10-CM | POA: Diagnosis not present

## 2021-04-06 MED ORDER — MELOXICAM 15 MG PO TABS
15.0000 mg | ORAL_TABLET | Freq: Every day | ORAL | 0 refills | Status: DC | PRN
  Filled 2021-04-06: qty 40, 40d supply, fill #0
  Filled 2021-04-06: qty 30, 30d supply, fill #0

## 2021-04-06 MED ORDER — DICLOFENAC SODIUM 1 % EX GEL
4.0000 g | Freq: Four times a day (QID) | CUTANEOUS | 3 refills | Status: DC
  Filled 2021-04-06: qty 100, 7d supply, fill #0

## 2021-04-06 NOTE — Assessment & Plan Note (Signed)
Patient with bilateral OA of knee. She has continued pain despite previously doing conservative therapy with home exercises, OTC analgesia, and has failed to have significant improvement with CSI and monovisc. We discussed the natural history of OA and progressive nature. I highly encouraged physical therapy to improve strength and ROM. Patient strongly prefers HEP, will give. Will also add in mobic and topical analgesia. For next steps, recommend patient follow up with Dr. August Saucer, orthopedics, if no improvement from continued conservative therapy as she may need to consider TKR in future. - mobic 15 mg qd prn - voltaren gel qid prn - follow up as needed

## 2021-04-06 NOTE — Progress Notes (Signed)
    SUBJECTIVE:   CHIEF COMPLAINT / HPI: b/l knee pain  55 yo woman presents today for follow up of b/l knee pain. She has knee osteoarthritis, confirmed on x-ray in June 2021 and October 2021. She previously had CSI in July and September 2021, which helped briefly, but eventually no longer provided relief. She then saw Dr. August Saucer, orthopedics, in October 2021 who recommended monovisc injections, which she did x1 in March 2022. Patient has tried OTC pain medication and was given a HEP, which she does intermittently. Patient does not believe exercises were beneficial. She reports aching pain present through most of the day, worse with use in b/l knees, L>R.  Patient is [place] speaking, requested to have her friend Ms. Ermalinda Memos interpret for her- signed consent form.  PERTINENT  PMH / PSH: b/l knee osteoarthritis  OBJECTIVE:   BP 120/79   Ht 5\' 1"  (1.549 m)   Wt 200 lb (90.7 kg)   BMI 37.79 kg/m   Nursing note and vitals reviewed GEN: age-appropriate African woman, resting comfortably in chair, NAD, obese, alert and at baseline Knees: Inspection was negative for erythema, ecchymosis; positive mild effusion on medial joint line. No obvious bony abnormalities or signs of osteophyte development. Palpation yielded no asymmetric warmth; Mild joint line tenderness; No condyle tenderness; Positive patellar tenderness; Positive patellar crepitus. Patellar and quadriceps tendons difficult to appreciate given habitus, and no tenderness of the pes anserine bursa. No obvious Baker's cyst development. ROM abnormal in flexion (110 degrees) and extension (5 degrees). Neurovascularly intact bilaterally. Psych: Pleasant and appropriate   ASSESSMENT/PLAN:   Osteoarthritis of knees, bilateral Patient with bilateral OA of knee. She has continued pain despite previously doing conservative therapy with home exercises, OTC analgesia, and has failed to have significant improvement with CSI and monovisc. We discussed  the natural history of OA and progressive nature. I highly encouraged physical therapy to improve strength and ROM. Patient strongly prefers HEP, will give. Will also add in mobic and topical analgesia. For next steps, recommend patient follow up with Dr. , orthopedics, if no improvement from continued conservative therapy as she may need to consider TKR in future. - mobic 15 mg qd prn - voltaren gel qid prn - follow up as needed     August Saucer, MD [facility]

## 2021-04-06 NOTE — Patient Instructions (Addendum)
It was a pleasure to see you today!  You have osteoarthritis of both knees causing pain. I recommend taking mobic 15 mg, one pill daily as needed for pain. You can also use voltaren gel to rub onto the knee up to 4 times a day for pain. You can also use ice for pain, and elevation for swelling. We have given you a home exercise program and I recommend you do these exercises at least once daily.   For next steps, if you do not have improvement with knee pain, I recommend you see Dr. August Saucer again. His office number is:  [phone]. He is an orthopedist, or bone doctor, and you should discuss with him next steps for knee arthritis including surgical options.   Be Well,  Dr. Marlane Hatcher fut un plaisir de vous voir aujourd'hui !  Vous souffrez d'arthrose des deux genoux qui cause de Armed forces logistics/support/administrative officer. Je recommande de prendre mobic 15 mg, une pilule par [employer] besoin pour [employer]. Vous pouvez galement utiliser le gel Voltaren pour frotter le genou jusqu' 4 fois par jour [employer]. Vous pouvez galement utiliser de la glace pour la douleur et de l'lvation pour l'enflure. Nous vous avons donn un programme d'exercices  domicile et je vous recommande de faire ces exercices au moins une fois par jour.  Pour les prochaines tapes, si vous n'avez pas d'amlioration [facility] au genou, je vous recommande de revoir le Dr August Saucer. Son numro de bureau est : [phone]. Il est orthopdiste ou mdecin des os, et vous devriez [employer] lui des prochaines tapes pour [employer] genou, y compris les options chirurgicales.  Nanetta Batty,  Dr Leary Roca

## 2021-04-07 ENCOUNTER — Encounter: Payer: Self-pay | Admitting: Sports Medicine

## 2021-05-18 ENCOUNTER — Other Ambulatory Visit (HOSPITAL_COMMUNITY): Payer: Self-pay

## 2021-05-18 ENCOUNTER — Ambulatory Visit (INDEPENDENT_AMBULATORY_CARE_PROVIDER_SITE_OTHER): Payer: 59 | Admitting: Sports Medicine

## 2021-05-18 ENCOUNTER — Other Ambulatory Visit: Payer: Self-pay

## 2021-05-18 VITALS — Ht 61.0 in | Wt 200.0 lb

## 2021-05-18 DIAGNOSIS — M170 Bilateral primary osteoarthritis of knee: Secondary | ICD-10-CM

## 2021-05-18 MED ORDER — TRAMADOL HCL 50 MG PO TABS
50.0000 mg | ORAL_TABLET | Freq: Four times a day (QID) | ORAL | 0 refills | Status: DC | PRN
  Filled 2021-05-18: qty 28, 7d supply, fill #0

## 2021-05-18 NOTE — Patient Instructions (Signed)
Merci d'tre venu aujourd'hui.  Ci-dessous votre plan de traitement:   Arrtez de prendre Mobic (stop taking mobic) Commencer  prendre Tramadol deux fois par [employer] besoin (start taking tramadol twice daily, only as needed) Physical therapy vous appellera pour [employer] premier [employer] (physical therapy will call you for your first appointment) Suivi dans [employer] semaines (we will see your in 4 weeks for a follow up appointment)

## 2021-05-18 NOTE — Progress Notes (Signed)
PCP: Garnette Gunner, MD  Subjective:   HPI: Patient is a 55 y.o. female here for 1 month follow up of b/l knee pain. She has a h/o knee OA confirmed via XR June and Oct 2021. She previously tried CSI in July and Sept 2021 which helped briefly but did not last. She tried monovisc injection in Mar 2022 with Dr. August Saucer in orthopedics which did not help. At her last visit her treatment plan included HEP, mobic 15mg  qd and voltaren gel prn. She reports no relief from the mobic or voltaren gel. The HEP is helping minimally. She is nervous about getting knee surgery.   Past Medical History:  Diagnosis Date   GERD (gastroesophageal reflux disease)    Knee pain    Lower back pain     Current Outpatient Medications on File Prior to Visit  Medication Sig Dispense Refill   diclofenac Sodium (VOLTAREN) 1 % GEL Apply 4 g topically 4 (four) times daily. 100 g 3   gabapentin (NEURONTIN) 300 MG capsule Take 1 capsule (300 mg total) by mouth at bedtime. 30 capsule 1   meloxicam (MOBIC) 15 MG tablet Take 1 tablet (15 mg total) by mouth daily as needed. 40 tablet 0   Multiple Vitamin (MULTIVITAMIN) tablet Take 1 tablet by mouth daily. Doesn't take daily     pantoprazole (PROTONIX) 40 MG tablet Take 40 mg by mouth daily.     tiZANidine (ZANAFLEX) 4 MG tablet TAKE 1 TABLET (4 MG TOTAL) BY MOUTH DAILY AS NEEDED FOR MUSCLE SPASMS. 30 tablet 6   Current Facility-Administered Medications on File Prior to Visit  Medication Dose Route Frequency Provider Last Rate Last Admin   0.9 %  sodium chloride infusion  500 mL Intravenous Continuous Nandigam, , MD        Past Surgical History:  Procedure Laterality Date   DIRECT LARYNGOSCOPY N/A 07/12/2018   Procedure: DIRECT LARYNGOSCOPY;  Surgeon: 09/11/2018, MD;  Location: [facility] OR;  Service: ENT;  Laterality: N/A;   knee injections     shoulder injections      No Known Allergies  Social History   Socioeconomic History   Marital status: Married    Spouse  name: Not on file   Number of children: 6   Years of education: Not on file   Highest education level: Not on file  Occupational History   Occupation: [employer]  Tobacco Use   Smoking status: Never   Smokeless tobacco: Never  Vaping Use   Vaping Use: Never used  Substance and Sexual Activity   Alcohol use: No   Drug use: No   Sexual activity: Yes  Other Topics Concern   Not on file  Social History Narrative   Originally from [place]. Has been in [place] for 16 years. [place] is second language. Unsure of first language. Speaks some/limited English.       Has 6 kids, 3 live with her. Lives with current boyfriend.    Social Determinants of Health   Financial Resource Strain: Not on file  Food Insecurity: Not on file  Transportation Needs: Not on file  Physical Activity: Not on file  Stress: Not on file  Social Connections: Not on file  Intimate Partner Violence: Not on file    Family History  Problem Relation Age of Onset   Sudden death Neg Hx    Hyperlipidemia Neg Hx    Heart attack Neg Hx    Diabetes Neg Hx  Hypertension Neg Hx    Heart disease Neg Hx    Colon cancer Neg Hx    Colon polyps Neg Hx    Esophageal cancer Neg Hx    Rectal cancer Neg Hx    Stomach cancer Neg Hx     There were no vitals taken for this visit.  No flowsheet data found.  No flowsheet data found.  Review of Systems: See HPI above.     Objective:  Physical Exam:  Gen: NAD, comfortable in exam room  Knee, bilateral: TTP noted at the medial and lateral joit line. Inspection was negative for erythema, ecchymosis, and effusion. Obvious bony hypertrophic changes and signs of osteophyte development. Palpation yielded no asymmetric warmth; No condyle tenderness; No patellar tenderness; +knee crepitus. Patellar and quadriceps tendons unremarkable, and no tenderness of the pes anserine bursa. No obvious Baker's cyst development. ROM limited normal in flexion (110 degrees) and  extension (0 degrees). Strength 5/5 with knee flexion and extension. Neurovascularly intact bilaterally.   Provocative Testing:    - Patella:   - Patellar grind/compression: NEG   - Patellar glide: Appropriate medial/lateral glide without apprehension - Cruciate Ligaments:   - Anterior Drawer/Lachman test: NEG - Posterior Drawer: NEG  - Collateral Ligaments:   - Varus/Valgus (MCL/LCL) Stress test at 0, 15d: NEG   - Apley's Compression/Distraction test: NEG  - Meniscus:   - McMurray's: NEG    Assessment & Plan:  1. Bilateral knee OA Nearing end stage, with significant pain and hypertrophic changes. It's beginning to impact her functional quality of life. At this point she has very little conservative therapy options left. Definitive treatment will be TKR but she would like to think this over and try the remaining few conservative therapy options as outlined below:  - discontinue mobic & begin taking tramadol 50mg  bid prn; OK to continue voltargen gel  - if the above is not working, I would strongly recommend surgery over medication adjustment - formal physical therapy; they will call her for her first appointment - follow up in 4 weeks - recommended scheduling an appt with Dr. to discuss surgery  August Saucer, MD, visiting resident  Patient seen and evaluated with the resident.  I agree with the above plan of care.  Patient understands that she needs a total knee arthroplasty but she has hesitant about proceeding.  We will try some tramadol and I will refer her for formal physical therapy.  She will follow-up with me again in 4 weeks for reevaluation.  She does understand that definitive treatment is total knee arthroplasty and we have suggested that she follow-up with Dr. Dolan Amen again to discuss this further.

## 2021-06-07 ENCOUNTER — Emergency Department (HOSPITAL_COMMUNITY)
Admission: EM | Admit: 2021-06-07 | Discharge: 2021-06-08 | Disposition: A | Discharge status: Home / Self Care | Payer: 59 | Attending: Emergency Medicine | Admitting: Emergency Medicine

## 2021-06-07 ENCOUNTER — Other Ambulatory Visit: Payer: Self-pay

## 2021-06-07 DIAGNOSIS — M545 Low back pain, unspecified: Secondary | ICD-10-CM | POA: Diagnosis present

## 2021-06-07 DIAGNOSIS — M79651 Pain in right thigh: Secondary | ICD-10-CM | POA: Insufficient documentation

## 2021-06-07 NOTE — ED Triage Notes (Signed)
Pt c/o right leg pain since Friday.

## 2021-06-08 ENCOUNTER — Emergency Department (HOSPITAL_COMMUNITY): Payer: 59

## 2021-06-08 ENCOUNTER — Encounter (HOSPITAL_COMMUNITY): Payer: Self-pay | Admitting: Student

## 2021-06-08 MED ORDER — METHOCARBAMOL 500 MG PO TABS: 500.0000 mg | ORAL_TABLET | Freq: Three times a day (TID) | ORAL | 0 refills | Status: DC | PRN

## 2021-06-08 MED ORDER — NAPROXEN 500 MG PO TABS: 500.0000 mg | ORAL_TABLET | Freq: Two times a day (BID) | ORAL | 0 refills | Status: DC | PRN

## 2021-06-08 MED ORDER — LIDOCAINE 5 % EX PTCH: 1.0000 | MEDICATED_PATCH | Freq: Every day | CUTANEOUS | 0 refills | Status: DC | PRN

## 2021-06-08 MED ORDER — KETOROLAC TROMETHAMINE 15 MG/ML IJ SOLN
30.0000 mg | Freq: Once | INTRAMUSCULAR | Status: AC
  Administered 2021-06-08: 30 mg via INTRAMUSCULAR
  Filled 2021-06-08: qty 2

## 2021-06-08 MED ORDER — LIDOCAINE 5 % EX PTCH
2.0000 | MEDICATED_PATCH | CUTANEOUS | Status: DC
  Administered 2021-06-08: 2 via TRANSDERMAL
  Filled 2021-06-08: qty 2

## 2021-06-08 NOTE — Discharge Instructions (Addendum)
You were seen in the emergency department for back pain today.  At this time we suspect that your pain is related to a muscle strain/spasm.   I have prescribed you an anti-inflammatory medication and a muscle relaxer.  - Naproxen is a nonsteroidal anti-inflammatory medication that will help with pain and swelling. Be sure to take this medication as prescribed with food, 1 pill every 12 hours,  It should be taken with food, as it can cause stomach upset, and more seriously, stomach bleeding. Do not take other nonsteroidal anti-inflammatory medications with this such as Advil, Motrin, Aleve, Mobic, Goodie Powder, diclofenac, meloxicam, ibuprofen, or Motrin.    - Robaxin is the muscle relaxer I have prescribed, this is meant to help with muscle tightness. Be aware that this medication may make you drowsy therefore the first time you take this it should be at a time you are in an environment where you can rest. Do not drive or operate heavy machinery when taking this medication. Do not drink alcohol or take other sedating medications with this medicine such as narcotics or benzodiazepines. Do not take zanaflex or other muscle relaxants with this medicine.   - Lidoderm patch- apply 1 patch to your area of most significant pain once per day. Remove and discard patch within 12 hours of application.   You make take Tylenol per over the counter dosing with these medications.   We have prescribed you new medication(s) today. Discuss the medications prescribed today with your pharmacist as they can have adverse effects and interactions with your other medicines including over the counter and prescribed medications. Seek medical evaluation if you start to experience new or abnormal symptoms after taking one of these medicines, seek care immediately if you start to experience difficulty breathing, feeling of your throat closing, facial swelling, or rash as these could be indications of a more serious allergic  reaction   The application of heat can help soothe the pain.     Please follow up with your sports medicine doctor as soon as possible. However return to the ER should you develop any new or worsening symptoms or any other concerns including but not limited to severe or worsening pain, low back pain with fever, numbness, weakness, loss of bowel or bladder control, or inability to walk or urinate, you should return to the ER immediately.     English to [place] Google Translate Vous Psychiatrist t vu aux urgences pour un mal de dos aujourd'hui.  l'heure actuelle, nous souponnons que votre douleur est lie  une tension musculaire ou  un spasme.  Je vous ai prescrit un mdicament anti-inflammatoire et un relaxant musculaire. - Le naproxne est un mdicament anti-inflammatoire non strodien Airline pilot et l'enflure. Assurez-vous de prendre ce mdicament tel que prescrit avec de la nourriture, 1 comprim toutes les 12 heures. Il doit tre pris avec de la nourriture, car il [employer] des [employer], plus grave, des Educational psychologist. Ne prenez pas d'autres mdicaments anti-inflammatoires non strodiens avec cela, tels que Advil, Motrin, Aleve, Mobic, Goodie Powder, diclofnac, mloxicam, ibuprofne ou Motrin.  - Robaxin est le relaxant musculaire que j'ai prescrit, il est destin  aider  la contraction musculaire. Sachez que ce mdicament peut vous rendre somnolent. Par consquent, la premire fois que vous le prenez, vous devez vous trouver [employer] un environnement o vous pouvez vous reposer. Ne conduisez pas et ne faites pas fonctionner de machinerie lourde lorsque vous prenez ce mdicament. Ne Kearney Hard  pas d'alcool et ne prenez pas d'autres mdicaments sdatifs avec ce mdicament, tels que des narcotiques ou des benzodiazpines. Ne prenez pas de zanaflex ou d'autres relaxants musculaires avec ce mdicament.  - Patch Lidoderm - appliquez 1 patch sur votre zone de [employer]  plus importante une fois par jour. Retirer et Geophysicist/field seismologist 12 heures suivant l'application.  Vous faites prendre du Tylenol en vente libre avec ces mdicaments.  Nous vous avons prescrit de nouveaux mdicaments aujourd'hui. Discutez des [employer] prescrits aujourd'hui Psychologist, forensic car ils peuvent avoir des effets indsirables et des interactions avec vos autres mdicaments, y compris les mdicaments en vente libre et les mdicaments prescrits. Demandez une valuation mdicale si vous commencez  ressentir des symptmes nouveaux ou anormaux aprs avoir pris l'un de ces mdicaments, consultez immdiatement si vous commencez  ressentir des difficults respiratoires, une sensation de fermeture de la gorge, un gonflement du visage ou une ruption cutane car ceux-ci pourraient tre des indications [employer] plus grave. raction allergique   L'application de Event organiser.   Veuillez consulter votre mdecin du sport ds que possible. Cependant, retournez aux urgences si vous dveloppez des symptmes nouveaux ou qui s'aggravent ou toute autre proccupation, y compris, mais sans s'y limiter, Visual merchandiser intense ou qui s'aggrave, une lombalgie accompagne de fivre, un engourdissement, une [place], une perte de contrle des intestins ou de la vessie ou une incapacit  marcher ou  Barrister's clerk. , vous devez retourner [employer].

## 2021-06-08 NOTE — ED Provider Notes (Signed)
[facility] EMERGENCY DEPARTMENT Provider Note   CSN: [id number] Arrival date & time: 06/07/21  2226     History Chief Complaint  Patient presents with   Back Pain    [name] is a 55 y.o. female with a hx of GERD & hyperlipidemia who presents to the ED with complaints of back pain x 3-4 days. Patient reports pain is in the lower back bilaterally and into the right thigh/gluteal region. Constant, worse with certain movements, no alleviating factors. Tried ibuprofen the other day without relief. Hx of similar pain in the past. No recent trauma/injury. Denies numbness, tingling, weakness, saddle anesthesia, incontinence to bowel/bladder, fever, chills, IV drug use, dysuria, or hx of cancer.  Interpretor utilized throughout Audiological scientist.   HPI     Past Medical History:  Diagnosis Date   GERD (gastroesophageal reflux disease)    Knee pain    Lower back pain     Patient Active Problem List   Diagnosis Date Noted   Osteoarthritis of knees, bilateral 04/06/2021   Greater trochanteric bursitis of left hip 01/24/2018   Right shoulder pain 07/24/2016   Atypical chest pain 06/17/2016   Osteoarthritis of right knee 04/21/2016   Left shoulder pain 05/15/2014   Low back pain 12/25/2013   Right hip pain 11/29/2013   Headache(784.0) 01/22/2013   Hyperlipidemia LDL goal < 160 07/12/2012   Tendinopathy of rotator cuff, right 06/26/2012   Language barrier 12/08/2011   Ganglion cyst 12/08/2011   Obesity (BMI 30.0-34.9) 12/08/2011    Past Surgical History:  Procedure Laterality Date   DIRECT LARYNGOSCOPY N/A 07/12/2018   Procedure: DIRECT LARYNGOSCOPY;  Surgeon: Newman Pies, MD;  Location: [facility] OR;  Service: ENT;  Laterality: N/A;   knee injections     shoulder injections       OB History   No obstetric history on file.     Family History  Problem Relation Age of Onset   Sudden death Neg Hx    Hyperlipidemia Neg Hx    Heart attack Neg Hx    Diabetes Neg Hx     Hypertension Neg Hx    Heart disease Neg Hx    Colon cancer Neg Hx    Colon polyps Neg Hx    Esophageal cancer Neg Hx    Rectal cancer Neg Hx    Stomach cancer Neg Hx     Social History   Tobacco Use   Smoking status: Never   Smokeless tobacco: Never  Vaping Use   Vaping Use: Never used  Substance Use Topics   Alcohol use: No   Drug use: No    Home Medications Prior to Admission medications   Medication Sig Start Date End Date Taking? Authorizing Provider  diclofenac Sodium (VOLTAREN) 1 % GEL Apply 4 g topically 4 (four) times daily. 04/06/21   Shirlean Mylar, MD  gabapentin (NEURONTIN) 300 MG capsule Take 1 capsule (300 mg total) by mouth at bedtime. 04/11/19   Ralene Cork, DO  meloxicam (MOBIC) 15 MG tablet Take 1 tablet (15 mg total) by mouth daily as needed. 04/06/21   Ralene Cork, DO  Multiple Vitamin (MULTIVITAMIN) tablet Take 1 tablet by mouth daily. Doesn't take daily    [provider]  pantoprazole (PROTONIX) 40 MG tablet Take 40 mg by mouth daily.    [provider]  tiZANidine (ZANAFLEX) 4 MG tablet TAKE 1 TABLET (4 MG TOTAL) BY MOUTH DAILY AS NEEDED FOR MUSCLE SPASMS. 12/22/20 12/22/21  Hilts,  Casimiro Needle, MD  traMADol (ULTRAM) 50 MG tablet Take 1 tablet (50 mg total) by mouth every 6 (six) hours as needed. 05/18/21   Ralene Cork, DO    Allergies    Patient has no known allergies.  Review of Systems   Review of Systems  Constitutional:  Negative for chills and fever.  Gastrointestinal:  Negative for abdominal pain and vomiting.  Genitourinary:  Negative for dysuria and hematuria.  Musculoskeletal:  Positive for back pain and myalgias.  Neurological:  Negative for weakness and numbness.  All other systems reviewed and are negative.  Physical Exam Updated Vital Signs BP 134/88 (BP Location: Right Arm)   Pulse 87   Temp 98.8 F (37.1 C) (Oral)   Resp 18   Ht 5\' 1"  (1.549 m)   Wt 90 kg   SpO2 100%   BMI 37.49 kg/m   Physical  Exam Vitals and nursing note reviewed.  Constitutional:      General: She is not in acute distress.    Appearance: She is well-developed. She is not ill-appearing or toxic-appearing.  HENT:     Head: Normocephalic and atraumatic.  Cardiovascular:     Rate and Rhythm: Normal rate.     Pulses:          Dorsalis pedis pulses are 2+ on the right side and 2+ on the left side.       Posterior tibial pulses are 2+ on the right side and 2+ on the left side.  Pulmonary:     Effort: Pulmonary effort is normal.  Abdominal:     General: There is no distension.     Palpations: Abdomen is soft.     Tenderness: There is no abdominal tenderness. There is no guarding or rebound.  Musculoskeletal:     Cervical back: Normal range of motion and neck supple. No spinous process tenderness or muscular tenderness.     Comments: Back: Diffuse lumbar tenderness- midline and bilateral paraspinal muscles.  Lower extremities: No obvious deformity, appreciable swelling, edema, erythema, ecchymosis, warmth, or open wounds. Patient has intact AROM to major joints. R gluteal tenderness to palpation. No focal bony tenderness. No calf tenderness.   Skin:    General: Skin is warm and dry.     Capillary Refill: Capillary refill takes less than 2 seconds.     Findings: No rash.  Neurological:     Mental Status: She is alert.     Deep Tendon Reflexes:     Reflex Scores:      Patellar reflexes are 2+ on the right side and 2+ on the left side.    Comments: Alert. Clear speech. Sensation grossly intact to bilateral lower extremities. 5/5 strength with knee flexion/extension and ankle plantar/dorsiflexion bilaterally.   Psychiatric:        Mood and Affect: Mood normal.        Behavior: Behavior normal.    ED Results / Procedures / Treatments   Labs (all labs ordered are listed, but only abnormal results are displayed) Labs Reviewed - No data to display  EKG None  Radiology DG Lumbar Spine Complete  Result Date:  06/08/2021 CLINICAL DATA:  Right leg pain, back pain EXAM: LUMBAR SPINE - COMPLETE 4+ VIEW COMPARISON:  10/27/2020 FINDINGS: Degenerative facet disease in the mid and lower lumbar spine. Disc spaces maintained. Normal alignment. No fracture. SI joints symmetric and unremarkable. IMPRESSION: Degenerative facet disease in the mid to lower lumbar spine. No acute bony abnormality. Electronically Signed  By: Charlett Nose M.D.   On: 06/08/2021 01:06    Procedures Procedures   Medications Ordered in ED Medications - No data to display  ED Course  I have reviewed the triage vital signs and the nursing notes.  Pertinent labs & imaging results that were available during my care of the patient were reviewed by me and considered in my medical decision making (see chart for details).    MDM Rules/Calculators/A&P                           Patient presents to the ED with complaints of  back pain.  Nontoxic, vitals without significant abnormality.   Additional history obtained:  Additional history obtained from chart review & nursing note review.  MRI 12/2020: 1. Multilevel spondylosis of the lumbar spine is greatest at L4-5 where there is grade 1 anterolisthesis, uncovering of a broad-based disc protrusion, and moderate facet hypertrophy. 2. Mild subarticular narrowing bilaterally at L4-5. 3. Moderate foraminal stenosis bilaterally at L4-5 is worse on the left. 4. Mild foraminal narrowing bilaterally at L3-4 secondary to disc bulging, facet hypertrophy, and a congenital element of short pedicles. 5. Mild facet hypertrophy bilaterally at L5-S1 without significant stenosis.  Prior injection  Imaging Studies ordered:  I ordered imaging studies which included L spine x-ray, I independently reviewed, formal radiology impression shows: Degenerative facet disease in the mid to lower lumbar spine. No acute bony abnormality  ED Course:  No neurologic deficits, ambulatory- doubt cauda equina  syndrome or acute cord compression. Afebrile, no hx of IVDU- doubt epidural abscess. No urinary sxs- feel that UTI/nephrolithiasis. Symmetric pulses, not a tearing sensation, overall well appearing, feel that dissection is unlikely at this time. NVI distally in lower extremities do not suspect ischemic limb. No edema of calf tenderness. Favor musculoskeletal pain- likely strain/spasm, possibly degree of sciatica. Will treat with Naproxen and Robaxin, discussed with patient that they are not to drive or operate heavy machinery while taking Robaxin. I discussed treatment plan, need for PCP follow-up, and return precautions with the patient. Provided opportunity for questions, patient confirmed understanding and is in agreement with plan.   Portions of this note were generated with Scientist, clinical (histocompatibility and immunogenetics). Dictation errors may occur despite best attempts at proofreading.    Final Clinical Impression(s) / ED Diagnoses Final diagnoses:  Bilateral low back pain, unspecified chronicity, unspecified whether sciatica present    Rx / DC Orders ED Discharge Orders          Ordered    lidocaine (LIDODERM) 5 %  Daily PRN        06/08/21 0118    naproxen (NAPROSYN) 500 MG tablet  2 times daily PRN        06/08/21 0118    methocarbamol (ROBAXIN) 500 MG tablet  Every 8 hours PRN        06/08/21 0118             Tye Juarez, Pleas Koch, PA-C 06/08/21 0122    Marily Memos, MD 06/08/21 [phone]

## 2021-08-16 ENCOUNTER — Other Ambulatory Visit: Payer: Self-pay | Admitting: Internal Medicine

## 2021-08-17 LAB — VITAMIN D 25 HYDROXY (VIT D DEFICIENCY, FRACTURES): Vit D, 25-Hydroxy: 19 ng/mL — ABNORMAL LOW (ref 30–100)

## 2021-08-17 LAB — COMPLETE METABOLIC PANEL WITH GFR
AG Ratio: 1.2 (calc) (ref 1.0–2.5)
ALT: 11 U/L (ref 6–29)
AST: 26 U/L (ref 10–35)
Albumin: 4.2 g/dL (ref 3.6–5.1)
Alkaline phosphatase (APISO): 62 U/L (ref 37–153)
BUN: 8 mg/dL (ref 7–25)
CO2: 28 mmol/L (ref 20–32)
Calcium: 8.9 mg/dL (ref 8.6–10.4)
Chloride: 103 mmol/L (ref 98–110)
Creat: 0.62 mg/dL (ref 0.50–1.03)
Globulin: 3.6 g/dL (calc) (ref 1.9–3.7)
Glucose, Bld: 81 mg/dL (ref 65–99)
Potassium: 4.1 mmol/L (ref 3.5–5.3)
Sodium: 141 mmol/L (ref 135–146)
Total Bilirubin: 0.4 mg/dL (ref 0.2–1.2)
Total Protein: 7.8 g/dL (ref 6.1–8.1)
eGFR: 104 mL/min/{1.73_m2} (ref >=60)

## 2021-08-17 LAB — CBC
HCT: 35.5 % (ref 35.0–45.0)
Hemoglobin: 12.1 g/dL (ref 11.7–15.5)
MCH: 29.2 pg (ref 27.0–33.0)
MCHC: 34.1 g/dL (ref 32.0–36.0)
MCV: 85.5 fL (ref 80.0–100.0)
MPV: 9.2 fL (ref 7.5–12.5)
Platelets: 288 10*3/uL (ref 140–400)
RBC: 4.15 10*6/uL (ref 3.80–5.10)
RDW: 12.9 % (ref 11.0–15.0)
WBC: 4.6 10*3/uL (ref 3.8–10.8)

## 2021-08-17 LAB — URINE CULTURE
MICRO NUMBER:: 12633765
SPECIMEN QUALITY:: ADEQUATE

## 2021-08-17 LAB — LIPID PANEL
Cholesterol: 190 mg/dL (ref <200)
HDL: 38 mg/dL — ABNORMAL LOW (ref >=50)
LDL Cholesterol (Calc): 120 mg/dL (calc) — ABNORMAL HIGH
Non-HDL Cholesterol (Calc): 152 mg/dL (calc) — ABNORMAL HIGH (ref <130)
Total CHOL/HDL Ratio: 5 (calc) — ABNORMAL HIGH (ref <5.0)
Triglycerides: 197 mg/dL — ABNORMAL HIGH (ref <150)

## 2021-08-17 LAB — TSH: TSH: 0.89 mIU/L (ref 0.40–4.50)

## 2021-08-31 ENCOUNTER — Ambulatory Visit (INDEPENDENT_AMBULATORY_CARE_PROVIDER_SITE_OTHER): Payer: 59 | Admitting: Sports Medicine

## 2021-08-31 VITALS — BP 144/84 | Ht 61.0 in | Wt 222.0 lb

## 2021-08-31 DIAGNOSIS — G8929 Other chronic pain: Secondary | ICD-10-CM | POA: Diagnosis not present

## 2021-08-31 DIAGNOSIS — M545 Low back pain, unspecified: Secondary | ICD-10-CM

## 2021-08-31 NOTE — Progress Notes (Signed)
[name] - 55 y.o. female MRN [id number]  Date of birth: [birth year]  SUBJECTIVE:   CC: back pain, f/u for bilateral knee pain   Ms. [name] is a 55 yo [place] American female with extensive PMHx including bilateral knee OA, chronic low back pain without sciatica, and chronic hip pain, who presents as a follow up for bilateral knee pain 2/2 OA and for worsening back pain.   Patient reports her knee pain is stable. She uses tylenol and motrin for pain and these are helpful. She has been wearing compression wraps around her knees which allow her to walk without difficulty. She will continue to defer TKR at this time.   Patient has had chronic back pain for several years. She had an MRI spine completed in March 2022 which showed moderate facet hypertrophy L4-L5, Moderate foraminal stenosis bilaterally L4-L5, multilevel spondylosis greatest at L4-L5. She saw PM&R Dr. Alvester Morin in June 2022 for the same complaint and had a facet joint injection L4-L5. She reports she had relief for one week following injection though pain returned and has persisted. She has pain along her flanks down to her lateral hips. She denies sx of radiculopathy. She has had to limit her work days to two per week and is unable to work two days in a row. After a work day she is unable to walk due to pain. She requests consideration for disability and handicap parking. She was referred to PT last OV though has not been contacted for an appointment.   ROS: Negative aside from above.  DATA REVIEWED:  MRI LUMBAR SPINE WITHOUT CONTRAST 1. Multilevel spondylosis of the lumbar spine is greatest at L4-5 where there is grade 1 anterolisthesis, uncovering of a broad-based disc protrusion, and moderate facet hypertrophy. 2. Mild subarticular narrowing bilaterally at L4-5. 3. Moderate foraminal stenosis bilaterally at L4-5 is worse on the left. 4. Mild foraminal narrowing bilaterally at L3-4 secondary to disc bulging, facet hypertrophy,  and a congenital element of short pedicles. 5. Mild facet hypertrophy bilaterally at L5-S1 without significant stenosis. By: Marin Roberts M.D.   On: 12/13/2020 15:26   PHYSICAL EXAM:  VS: BP:(!) 144/84  HR: bpm  TEMP: ( )  RESP:   HT:5\' 1"  (154.9 cm)   WT:222 lb (100.7 kg)  BMI:41.97 PHYSICAL EXAM: Gen: NAD, alert, cooperative with exam, well-appearing, obese HEENT: clear conjunctiva, nontraumatic CV:  no peripheral edema, extremities well perfused Resp: non-labored breathing on room air Skin: no rashes, normal turgor  Neuro: no gross deficits  Psych:  normal affect MSK:  Lumbar back rotation and lateral flexion ROM full without pain. No tenderness to palpation lumbar spinous processes. Tenderness to palpation lower flanks and lateral posterior hips bilaterally. Negative straight leg raise bilaterally Hip ROM full, strength intact, negative log roll  ASSESSMENT & PLAN:   Ms. [name] is a 55 yo African American female with extensive PMHx who presents as a follow up for bilateral knee pain 2/2 OA and for worsening back pain.  Bilateral Knee Osteoarthritis: Stable, continue conservative management. Once patient is no longer able to tolerate daily pain with conservative management alone, she will need to reconsider TKR.  Chronic Lumbar Back Pain 2/2 L4-L5 Facet Hypertrophy: Patient presents with imaging findings and clinical presentation consistent with chronic lumbar back pain 2/2 L4-L5 facet hypertrophy given locally referred pain to the lateral hips, and no sx of radiculopathy. Patient had periodic relief with injection of the facet joint which supports this diagnosis. At this time  would recommend repeat facet joint injection with PM&R, and if patient continues to have relief with these then she may be a candidate for lumbar radiofrequency ablation procedure if pain persists. Patient is not a candidate for disability at this time. She has pathology that limits mobility and  qualifies for a handicap parking pass.   Portions of this report may have been transcribed using voice recognition software. Every effort was made to ensure accuracy; however, inadvertent computerized transcription errors may be present.   Wayland Denis, MD 08/31/21,  7:27 PM Pager: [phone]  Patient seen and evaluated with the resident.  I agree with the above plan of care.  I would like for this patient to undergo a second round of facet injections with Dr. Ernestina Patches.  If she notes a positive but limited response to these injections then she may be a candidate for radiofrequency ablation.  Since she is not currently experiencing radiculopathy I do not believe a lumbar ESI will be beneficial.  If she does not receive any improvement with the second round of facet injections consider surgical referral at that time.  Follow-up as needed. Internal Medicine Resident, PGY-1 [facility]

## 2021-09-06 ENCOUNTER — Other Ambulatory Visit: Payer: Self-pay

## 2021-09-06 ENCOUNTER — Ambulatory Visit (INDEPENDENT_AMBULATORY_CARE_PROVIDER_SITE_OTHER): Payer: 59 | Admitting: Physical Medicine and Rehabilitation

## 2021-09-06 ENCOUNTER — Encounter: Payer: Self-pay | Admitting: Physical Medicine and Rehabilitation

## 2021-09-06 DIAGNOSIS — M545 Low back pain, unspecified: Secondary | ICD-10-CM | POA: Diagnosis not present

## 2021-09-06 DIAGNOSIS — G8929 Other chronic pain: Secondary | ICD-10-CM | POA: Diagnosis not present

## 2021-09-06 DIAGNOSIS — M47816 Spondylosis without myelopathy or radiculopathy, lumbar region: Secondary | ICD-10-CM

## 2021-09-06 NOTE — Progress Notes (Signed)
[name] - 55 y.o. female MRN [id number]  Date of birth: [birth year]  Office Visit Note: Visit Date: 09/06/2021 PCP: Bonnita Hollow, MD Referred by: Bonnita Hollow, MD  Subjective: Chief Complaint  Patient presents with   Lower Back - Pain   HPI: [name] is a 55 y.o. female who comes in today per the request of Dr. Lilia Argue for evaluation of bilateral lower back pain.  Patient reports pain has been ongoing for several months and is exacerbated by movement, activity and walking.  Patient describes pain as a sharp and sore sensation and currently rates as 8 out of 10.  Patient states her pain is so severe some days that she has trouble walking.  Patient reports some relief of pain with home exercise regimen, Tylenol and Ibuprofen.  Patient states she recently had to visit the emergency department due to severe lower back pain.  Patient states she was prescribed Lidocaine patches, Naproxen and Robaxin from the emergency department, however she reports these medications did not help to significantly alleviate her pain.  Patient's lumbar MRI from March exhibits multilevel facet hypertrophy, moderate at the level of L4-L5, no high-grade spinal canal stenosis noted.  Patient states she works in a [employer] traffic [employer] that requires her to bend over and lift heavy boxes frequently.  Patient states she recently cut back her hours at work due to severe pain and states she is unable to tolerate working for long periods of time.  Patient was referred to formal physical therapy by Dr. Lilia Argue, however patient states she was not contacted to set up therapy.  Patient states she does not believe she will be able to tolerate physical therapy at this time and would like to try a lower back injection first.  Patient denies focal weakness, numbness and tingling.  Patient denies recent trauma or falls.   Review of Systems  Musculoskeletal:  Positive for back pain.  Neurological:   Negative for tingling, sensory change, focal weakness and weakness.  All other systems reviewed and are negative. Otherwise per HPI.  Assessment & Plan: Visit Diagnoses:    ICD-10-CM   1. Chronic bilateral low back pain without sciatica  M54.50    G89.29     2. Spondylosis without myelopathy or radiculopathy, lumbar region  M47.816     3. Facet hypertrophy of lumbar region  M47.816        Plan: Findings:  Chronic, worsening and severe bilateral axial back pain.  No radicular symptoms noted at this time.  Patient is noted to have pain upon facet loading.  Patient continues to have excruciating pain despite good conservative therapy such as home exercise regimen and use of medications.  Patient's clinical presentation and exam are consistent with facet mediated pain.  We believe the next step is to perform diagnostic and hopefully therapeutic bilateral L4-L5 facet joint injections under fluoroscopic guidance.  If patient does well with facet joint injections we did speak with her today in detail about the possibility of her performing radiofrequency ablation.  We did give patient educational literature for her to take home and review regarding radiofrequency ablation procedure.  We did encourage patient to stay active and continue home exercise regimen as tolerated.  No red flag symptoms noted upon exam today.  Patient continues with physician directed home exercise program. Current medication management is not beneficial in increasing her functional status. Please note that procedures are done as part of a comprehensive orthopedic and pain  management program with access to in-house orthopedics, spine surgery and physical therapy as well as access to [place] biopsychosocial counseling if needed.      Meds & Orders: No orders of the defined types were placed in this encounter.  No orders of the defined types were placed in this encounter.   Follow-up: Return for Bilateral L4-L5  facet joint injections.   Procedures: No procedures performed      Clinical History: MRI LUMBAR SPINE WITHOUT CONTRAST   TECHNIQUE: Multiplanar, multisequence MR imaging of the lumbar spine was performed. No intravenous contrast was administered.   COMPARISON:  Spine radiographs 10/27/20   FINDINGS: Segmentation: 5 non rib-bearing lumbar type vertebral bodies are present. The lowest fully formed vertebral body is L5.   Alignment: Slight anterolisthesis is present at L4-5. No other significant listhesis or scoliosis is present.   Vertebrae: Mild chronic endplate marrow changes present at L3-4. Edematous changes are present about the facet joints at L4-5 bilaterally, left greater than right. Subchondral cysts are present about the joint at L4-5 on the left. Marrow signal and vertebral body heights are otherwise normal.   Conus medullaris and cauda equina: Conus extends to the L2 level. Conus and cauda equina appear normal.   Paraspinal and other soft tissues: Cystic changes are present in the kidneys bilaterally. No solid lesions are present. No significant adenopathy is present.   Disc levels:   T12-L1: Negative.   L1-2: Mild disc bulging facet hypertrophy is present without significant stenosis.   L2-3: Mild disc bulging and facet hypertrophy is present. No significant stenosis is present.   L3-4: Mild disc bulging and facet hypertrophy is present. In the setting of short pedicles this results in mild foraminal narrowing bilaterally.   L4-5: There is uncovering of a broad-based disc protrusion. Moderate facet hypertrophy is noted bilaterally. This results in mild subarticular narrowing bilaterally. Moderate foraminal stenosis is worse left than right.   L5-S1: Mild facet hypertrophy is present bilaterally. No significant stenosis is present.   IMPRESSION: 1. Multilevel spondylosis of the lumbar spine is greatest at L4-5 where there is grade 1 anterolisthesis,  uncovering of a broad-based disc protrusion, and moderate facet hypertrophy. 2. Mild subarticular narrowing bilaterally at L4-5. 3. Moderate foraminal stenosis bilaterally at L4-5 is worse on the left. 4. Mild foraminal narrowing bilaterally at L3-4 secondary to disc bulging, facet hypertrophy, and a congenital element of short pedicles. 5. Mild facet hypertrophy bilaterally at L5-S1 without significant stenosis.     Electronically Signed   By: San Morelle M.D.   On: 12/13/2020 15:26   She reports that she has never smoked. She has never used smokeless tobacco. No results for input(s): HGBA1C, LABURIC in the last 8760 hours.  Objective:  VS:  HT:    WT:   BMI:     BP:   HR: bpm  TEMP: ( )  RESP:  Physical Exam Vitals and nursing note reviewed.  HENT:     Head: Normocephalic and atraumatic.     Right Ear: External ear normal.     Left Ear: External ear normal.     Nose: Nose normal.     Mouth/Throat:     Mouth: Mucous membranes are moist.  Eyes:     Extraocular Movements: Extraocular movements intact.  Cardiovascular:     Rate and Rhythm: Normal rate.     Pulses: Normal pulses.  Pulmonary:     Effort: Pulmonary effort is normal.  Abdominal:  General: Abdomen is flat. There is no distension.  Musculoskeletal:        General: Tenderness present.     Cervical back: Normal range of motion.     Comments: Pt is slow to rise from seated position to standing. Concordant low back pain with facet loading, lumbar spine extension and rotation. Strong distal strength without clonus, no pain upon palpation of greater trochanters. Sensation intact bilaterally. Walks independently, gait steady.     Skin:    General: Skin is warm and dry.     Capillary Refill: Capillary refill takes less than 2 seconds.  Neurological:     General: No focal deficit present.     Mental Status: She is alert and oriented to person, place, and time.  Psychiatric:        Mood and Affect:  Mood normal.    Ortho Exam  Imaging: No results found.  Past Medical/Family/Surgical/Social History: Medications & Allergies reviewed per EMR, new medications updated. Patient Active Problem List   Diagnosis Date Noted   Osteoarthritis of knees, bilateral 04/06/2021   Greater trochanteric bursitis of left hip 01/24/2018   Right shoulder pain 07/24/2016   Atypical chest pain 06/17/2016   Osteoarthritis of right knee 04/21/2016   Left shoulder pain 05/15/2014   Low back pain 12/25/2013   Right hip pain 11/29/2013   Headache(784.0) 01/22/2013   Hyperlipidemia LDL goal < 160 07/12/2012   Tendinopathy of rotator cuff, right 06/26/2012   Language barrier 12/08/2011   Ganglion cyst 12/08/2011   Obesity (BMI 30.0-34.9) 12/08/2011   Past Medical History:  Diagnosis Date   GERD (gastroesophageal reflux disease)    Knee pain    Lower back pain    Family History  Problem Relation Age of Onset   Sudden death Neg Hx    Hyperlipidemia Neg Hx    Heart attack Neg Hx    Diabetes Neg Hx    Hypertension Neg Hx    Heart disease Neg Hx    Colon cancer Neg Hx    Colon polyps Neg Hx    Esophageal cancer Neg Hx    Rectal cancer Neg Hx    Stomach cancer Neg Hx    Past Surgical History:  Procedure Laterality Date   DIRECT LARYNGOSCOPY N/A 07/12/2018   Procedure: DIRECT LARYNGOSCOPY;  Surgeon: Newman Pies, MD;  Location: [facility] OR;  Service: ENT;  Laterality: N/A;   knee injections     shoulder injections     Social History   Occupational History   Occupation: Regulatory affairs officer  Tobacco Use   Smoking status: Never   Smokeless tobacco: Never  Vaping Use   Vaping Use: Never used  Substance and Sexual Activity   Alcohol use: No   Drug use: No   Sexual activity: Yes

## 2021-09-06 NOTE — Progress Notes (Signed)
Numeric Pain Rating Scale and Functional Assessment Average Pain 4   In the last MONTH (on 0-10 scale) has pain interfered with the following?  1. General activity like being  able to carry out your everyday physical activities such as walking, climbing stairs, carrying groceries, or moving a chair?  Rating(10)   Had injection in June 2022. Only helped one week. Pain is constant. Takes Tylenol and ibuprofen. Had xrays and MRI done. Worse with movement.

## 2021-09-10 ENCOUNTER — Telehealth: Payer: Self-pay | Admitting: Physical Medicine and Rehabilitation

## 2021-09-10 NOTE — Telephone Encounter (Signed)
Pt's daughter called to set appt for injection. Pt has insurance now and would like to set an appt. Pt phone number is [phone]

## 2021-10-11 ENCOUNTER — Ambulatory Visit: Payer: 59

## 2021-10-11 ENCOUNTER — Other Ambulatory Visit: Payer: Self-pay

## 2021-10-11 ENCOUNTER — Encounter: Payer: Self-pay | Admitting: Physical Medicine and Rehabilitation

## 2021-10-11 ENCOUNTER — Ambulatory Visit (INDEPENDENT_AMBULATORY_CARE_PROVIDER_SITE_OTHER): Payer: 59 | Admitting: Physical Medicine and Rehabilitation

## 2021-10-11 VITALS — BP 138/86 | HR 94

## 2021-10-11 DIAGNOSIS — M47816 Spondylosis without myelopathy or radiculopathy, lumbar region: Secondary | ICD-10-CM

## 2021-10-11 MED ORDER — METHYLPREDNISOLONE ACETATE 80 MG/ML IJ SUSP: 80.0000 mg | Freq: Once | INTRAMUSCULAR | Status: DC

## 2021-10-11 NOTE — Patient Instructions (Signed)

## 2021-10-11 NOTE — Progress Notes (Signed)
Pt state lower back pain that travels down both knees. Pt state standing and walking makes the pain worse. Pt state she takes over the counter pain meds to help ease her pain. Pt has hx of inj on 03/25/21 pt state it helped.   Numeric Pain Rating Scale and Functional Assessment Average Pain 7   In the last MONTH (on 0-10 scale) has pain interfered with the following?  1. General activity like being  able to carry out your everyday physical activities such as walking, climbing stairs, carrying groceries, or moving a chair?  Rating(9)   +Driver, -BT, -Dye Allergies.

## 2021-10-26 ENCOUNTER — Ambulatory Visit (INDEPENDENT_AMBULATORY_CARE_PROVIDER_SITE_OTHER): Payer: 59 | Admitting: Sports Medicine

## 2021-10-26 VITALS — BP 134/94 | Ht 61.0 in | Wt 222.0 lb

## 2021-10-26 DIAGNOSIS — M25511 Pain in right shoulder: Secondary | ICD-10-CM

## 2021-10-26 DIAGNOSIS — M7551 Bursitis of right shoulder: Secondary | ICD-10-CM

## 2021-10-26 MED ORDER — METHYLPREDNISOLONE ACETATE 40 MG/ML IJ SUSP
40.0000 mg | Freq: Once | INTRAMUSCULAR | Status: AC
  Administered 2021-10-26: 11:00:00 40 mg via INTRA_ARTICULAR

## 2021-10-26 NOTE — Progress Notes (Signed)
PCP: Garnette Gunner, MD  Subjective:   HPI: Patient is a 56 y.o. female here for right shoulder pain.  Patient reports acute onset of right shoulder pain x 2 days.  She awoke on Sunday morning with pain in decreased range of motion of the shoulder.  She denies any injury or inciting event.  She denies any redness, swelling, no fever or chills or self.  The pain is located over the anterior lateral aspect of the shoulder.  Radiate into the arm to the elbow.  She denies any numbness or tingling or loss of grip strength.  She does have some intermittent neck pain, but states this seems separate from this.  She has had a history of left shoulder issues, but her right has not previously given her any problems.  She reports diminished range of motion in all directions.  Take some ibuprofen twice a day over the weekend with only mild improvement.   Past Medical History:  Diagnosis Date   GERD (gastroesophageal reflux disease)    Knee pain    Lower back pain     Current Outpatient Medications on File Prior to Visit  Medication Sig Dispense Refill   diclofenac Sodium (VOLTAREN) 1 % GEL Apply 4 g topically 4 (four) times daily. 100 g 3   gabapentin (NEURONTIN) 300 MG capsule Take 1 capsule (300 mg total) by mouth at bedtime. 30 capsule 1   lidocaine (LIDODERM) 5 % Place 1 patch onto the skin daily as needed. Apply patch to area most significant pain once per day.  Remove and discard patch within 12 hours of application. 30 patch 0   methocarbamol (ROBAXIN) 500 MG tablet Take 1 tablet (500 mg total) by mouth every 8 (eight) hours as needed for muscle spasms. 15 tablet 0   Multiple Vitamin (MULTIVITAMIN) tablet Take 1 tablet by mouth daily. Doesn't take daily     naproxen (NAPROSYN) 500 MG tablet Take 1 tablet (500 mg total) by mouth 2 (two) times daily as needed for moderate pain. 15 tablet 0   pantoprazole (PROTONIX) 40 MG tablet Take 40 mg by mouth daily.     traMADol (ULTRAM) 50 MG tablet Take 1  tablet (50 mg total) by mouth every 6 (six) hours as needed. 30 tablet 0   Current Facility-Administered Medications on File Prior to Visit  Medication Dose Route Frequency Provider Last Rate Last Admin   0.9 %  sodium chloride infusion  500 mL Intravenous Continuous Nandigam, Eleonore Chiquito, MD       methylPREDNISolone acetate (DEPO-MEDROL) injection 80 mg  80 mg Other Once Tyrell Antonio, MD        Past Surgical History:  Procedure Laterality Date   DIRECT LARYNGOSCOPY N/A 07/12/2018   Procedure: DIRECT LARYNGOSCOPY;  Surgeon: Newman Pies, MD;  Location: [facility] OR;  Service: ENT;  Laterality: N/A;   knee injections     shoulder injections      No Known Allergies  BP (!) 134/94    Ht 5\' 1"  (1.549 m)    Wt 222 lb (100.7 kg)    BMI 41.95 kg/m   No flowsheet data found.  No flowsheet data found.      Objective:  Physical Exam:  Gen: Well-appearing, in no acute distress; non-toxic CV: Regular Rate. Well-perfused. Warm.  Resp: Breathing unlabored on room air; no wheezing. Psych: Fluid speech in conversation; appropriate affect; normal thought process Neuro: Sensation intact throughout. No gross coordination deficits.  MSK:   - Right shoulder: Inspection  yields no erythema, effusion or ecchymosis.  There is positive TTP over the acromion and mild generalized TTP throughout the shoulder and deltoid region.  There is limited active range of motion in all directions, patient only able to Flex the arm to approximately 25 And minimal active abduction.  She is able to externally rotate equivocally to the contralateral side albeit with some pain.  She is able to be passively moved to approximate 110 of flexion and 100 of abduction.  Full range of motion and strength at the elbow and wrist. + Resisted ER. Neurovascularly intact distally.   Assessment & Plan:  1. Acute right shoulder pain - most likely subacromial bursitis. No injury or inciting event. Passive ROM intact, so this is not adhesive  capsulitis.   Procedure: Subacromial injection, right shoulder After informed written consent timeout was performed, patient was seated in chair in exam room. The patient's right shoulder was prepped with Betadine and alcohol swab and utilizing posterolateral approach, the patient's subacromial space was injected with 4:1 lidocaine: depomedrol. Patient tolerated the procedure well without immediate complications.  Plan: - Walked through all treatment options today, through shared decision making elected to proceed with cortisone injection to the right shoulder. Patient tolerated well. -Once her acute pain subsides, we discussed over the next few days beginning gentle range of motion exercises for the shoulder/RTC -She may use Tylenol or ibuprofen for post injection pain if necessary -Follow-up as needed  Madelyn Brunner, DO PGY-4, Sports Medicine Fellow [facility]  Patient seen and evaluated with the sports medicine fellow.  I agree with the above plan of care.  Subacromial cortisone injection to the right shoulder administered as above.  Follow-up for ongoing or recalcitrant issues.

## 2021-10-27 ENCOUNTER — Ambulatory Visit: Payer: 59 | Admitting: Nurse Practitioner

## 2021-11-03 ENCOUNTER — Encounter: Payer: Self-pay | Admitting: Sports Medicine

## 2021-11-03 ENCOUNTER — Ambulatory Visit (INDEPENDENT_AMBULATORY_CARE_PROVIDER_SITE_OTHER): Payer: 59 | Admitting: Sports Medicine

## 2021-11-03 ENCOUNTER — Other Ambulatory Visit (HOSPITAL_COMMUNITY): Payer: Self-pay

## 2021-11-03 VITALS — BP 139/84 | Ht 61.0 in | Wt 222.0 lb

## 2021-11-03 DIAGNOSIS — M25511 Pain in right shoulder: Secondary | ICD-10-CM | POA: Diagnosis not present

## 2021-11-03 MED ORDER — NAPROXEN 500 MG PO TABS: 500.0000 mg | ORAL_TABLET | Freq: Two times a day (BID) | ORAL | 0 refills | Status: DC | PRN

## 2021-11-03 MED ORDER — NAPROXEN 500 MG PO TABS
500.0000 mg | ORAL_TABLET | Freq: Two times a day (BID) | ORAL | 0 refills | Status: DC | PRN
  Filled 2021-11-03: qty 30, 15d supply, fill #0

## 2021-11-03 NOTE — Progress Notes (Signed)
PCP: Garnette Gunner, MD  Subjective:   HPI: Patient is a 56 y.o. female here for follow-up of right shoulder pain.  Patient was previously seen on 10/26/2021 after acute onset of right shoulder pain.  Given the degree of her pain and limited range of motion, she was subsequently injected with a subacromial corticosteroid by myself.  The patient experienced worsening of her pain the following 2 days, when it was difficult to sleep at that time.  She called the office, and our staff discussed this was likely a steroid flare and encouraged her to ice for pain relief.  She was unable to ice but did take ibuprofen and an additional pain medication she had at home from a prior dental procedure to relieve the pain.  After about 3 days after the injection, her pain started to improve.  She states today her pain is about 75% improved from her last appointment.  She has noticed significant improvement in range of motion in the shoulder as well.  She is now able to reach overhead and out without significant pain.  She still has a mild degree of pain with certain overhead or reaching activities, although this is much improved.  She is currently not taking any medications as her pain has been relieved.  She does admit that she has not been able to do the home exercises for the rotator cuff/shoulder.  Denies any new injuries.  No fever or chills.  Past Medical History:  Diagnosis Date   GERD (gastroesophageal reflux disease)    Knee pain    Lower back pain     Current Outpatient Medications on File Prior to Visit  Medication Sig Dispense Refill   diclofenac Sodium (VOLTAREN) 1 % GEL Apply 4 g topically 4 (four) times daily. 100 g 3   gabapentin (NEURONTIN) 300 MG capsule Take 1 capsule (300 mg total) by mouth at bedtime. 30 capsule 1   lidocaine (LIDODERM) 5 % Place 1 patch onto the skin daily as needed. Apply patch to area most significant pain once per day.  Remove and discard patch within 12 hours of  application. 30 patch 0   methocarbamol (ROBAXIN) 500 MG tablet Take 1 tablet (500 mg total) by mouth every 8 (eight) hours as needed for muscle spasms. 15 tablet 0   Multiple Vitamin (MULTIVITAMIN) tablet Take 1 tablet by mouth daily. Doesn't take daily     pantoprazole (PROTONIX) 40 MG tablet Take 40 mg by mouth daily.     traMADol (ULTRAM) 50 MG tablet Take 1 tablet (50 mg total) by mouth every 6 (six) hours as needed. 30 tablet 0   Current Facility-Administered Medications on File Prior to Visit  Medication Dose Route Frequency Provider Last Rate Last Admin   0.9 %  sodium chloride infusion  500 mL Intravenous Continuous Nandigam, Eleonore Chiquito, MD       methylPREDNISolone acetate (DEPO-MEDROL) injection 80 mg  80 mg Other Once Tyrell Antonio, MD        Past Surgical History:  Procedure Laterality Date   DIRECT LARYNGOSCOPY N/A 07/12/2018   Procedure: DIRECT LARYNGOSCOPY;  Surgeon: Newman Pies, MD;  Location: [facility] OR;  Service: ENT;  Laterality: N/A;   knee injections     shoulder injections      No Known Allergies  BP 139/84    Ht 5\' 1"  (1.549 m)    Wt 222 lb (100.7 kg)    BMI 41.95 kg/m   No flowsheet data found.  No flowsheet  data found.      Objective:  Physical Exam:  Gen: Well-appearing, in no acute distress; non-toxic CV: Regular Rate. Well-perfused. Warm.  Resp: Breathing unlabored on room air; no wheezing. Psych: Fluid speech in conversation; appropriate affect; normal thought process Neuro: Sensation intact throughout. No gross coordination deficits.  MSK:  - Right shoulder: Inspection knee is no erythema, effusion or ecchymosis.  No significant TTP over the acromion, AC joint.  There is improved active range of motion in all directions.  Able to actively flex the arm to approximately 140 degrees, lateral abduction to approximately 165 degrees with very minimal pain.  Able to be taken for slightly further passively although some pain with endrange mild pain with resisted  external rotation. There is full range of motion with internal/external rotation actively.  Negative crossarm adduction.  5/5 strength in bilateral upper extremities.  Neurovascular intact distally.    Assessment & Plan:  1.  Acute right shoulder pain - 75% improvement status post subacromial injection. Doing better.  -Discussed with patient she likely had a steroid flare, but encouraged that this is now completely resolved and her shoulder pain and range of motion is markedly improved -Reprinted out shoulder home exercises, Jobe's, Int/Ext rotation, spokes of wheel and encouraged patient to do these once daily as this will help for long-term prevention of her pain -Did prescribe Naprosyn 500 mg to be taken twice daily only as needed for any breakthrough pain, encouraged to take with food -Follow-up on an as-needed basis -Happy birthday!  Madelyn Brunner, DO PGY-4, Sports Medicine Fellow [facility]  Addendum:  I was the preceptor for this visit and available for immediate consultation.  Norton Blizzard MD Marrianne Mood

## 2021-11-03 NOTE — Patient Instructions (Signed)
[name],  It was great to see you again today, I am so glad that the shoulder is feeling better.  The injection should continue to help your pain, but it is important that you do the home exercises, try to do these once a day, every day for the next month.  These will help decrease your pain in the long run and will improve your range of motion and function of the shoulder and arm.  Things to do: -You may ice the shoulder for 20 minutes if it is painful -Perform your shoulder exercises at home once a day, this should take about 10 minutes -If you are having pain, you may take Naprosyn 1 tablet twice a day as needed.  Be sure to take this with food.  You will follow-up with me if your pain returns or as needed.  If you have any further questions, please give the clinic a call [phone].  Cheers,  Madelyn Brunner, DO Sports Medicine Fellow [facility]

## 2021-11-06 NOTE — Progress Notes (Signed)
[name] - 56 y.o. female MRN [id number]  Date of birth: 1966/01/23  Office Visit Note: Visit Date: 10/11/2021 PCP: Bonnita Hollow, MD Referred by: Bonnita Hollow, MD  Subjective: Chief Complaint  Patient presents with   Lower Back - Pain   Left Knee - Pain   Right Knee - Pain   HPI:  [name] is a 56 y.o. female who comes in today at the request of Barnet Pall, FNP for planned Bilateral  L4-5 Lumbar facet/medial branch block with fluoroscopic guidance.  The patient has failed conservative care including home exercise, medications, time and activity modification.  This injection will be diagnostic and hopefully therapeutic.  Please see requesting physician notes for further details and justification.  Exam has shown concordant pain with facet joint loading.   ROS Otherwise per HPI.  Assessment & Plan: Visit Diagnoses:    ICD-10-CM   1. Spondylosis without myelopathy or radiculopathy, lumbar region  M47.816 XR C-ARM NO REPORT    Facet Injection    DISCONTINUED: methylPREDNISolone acetate (DEPO-MEDROL) injection 80 mg      Plan: No additional findings.   Meds & Orders:  Meds ordered this encounter  Medications   DISCONTD: methylPREDNISolone acetate (DEPO-MEDROL) injection 80 mg    Orders Placed This Encounter  Procedures   Facet Injection   XR C-ARM NO REPORT    Follow-up: Return for Review Pain Diary.   Procedures: No procedures performed  Lumbar Diagnostic Facet Joint Nerve Block with Fluoroscopic Guidance   Patient: [name]      Date of Birth: 1966-06-16 MRN: [id number] PCP: Bonnita Hollow, MD      Visit Date: 10/11/2021   Universal Protocol:    Date/Time: 02/04/236:45 AM  Consent Given By: the patient  Position: PRONE  Additional Comments: Vital signs were monitored before and after the procedure. Patient was prepped and draped in the usual sterile fashion. The correct patient, procedure, and site was verified.   Injection Procedure  Details:   Procedure diagnoses:  1. Spondylosis without myelopathy or radiculopathy, lumbar region      Meds Administered:  Meds ordered this encounter  Medications   methylPREDNISolone acetate (DEPO-MEDROL) injection 80 mg     Laterality: Bilateral  Location/Site: L4-L5, L3 and L4 medial branches  Needle: 5.0 in., 25 ga.  Short bevel or Quincke spinal needle  Needle Placement: Oblique pedical  Findings:   -Comments: There was excellent flow of contrast along the articular pillars without intravascular flow.  Procedure Details: The fluoroscope beam is vertically oriented in AP and then obliqued 15 to 20 degrees to the ipsilateral side of the desired nerve to achieve the Scotty dog appearance.  The skin over the target area of the junction of the superior articulating process and the transverse process (sacral ala if blocking the L5 dorsal rami) was locally anesthetized with a 1 ml volume of 1% Lidocaine without Epinephrine.  The spinal needle was inserted and advanced in a trajectory view down to the target.   After contact with periosteum and negative aspirate for blood and CSF, correct placement without intravascular or epidural spread was confirmed by injecting 0.5 ml. of Isovue-250.  A spot radiograph was obtained of this image.    Next, a 0.5 ml. volume of the injectate described above was injected. The needle was then redirected to the other facet joint nerves mentioned above if needed.  Prior to the procedure, the patient was given a Pain Diary which was completed for baseline measurements.  After the procedure, the patient rated their pain every 30 minutes and will continue rating at this frequency for a total of 5 hours.  The patient has been asked to complete the Diary and return to [place] by mail, fax or hand delivered as soon as possible.   Additional Comments:  No complications occurred Dressing: 2 x 2 sterile gauze and Band-Aid    Post-procedure details: Patient  was observed during the procedure. Post-procedure instructions were reviewed.  Patient left the clinic in stable condition.   Clinical History: MRI LUMBAR SPINE WITHOUT CONTRAST   TECHNIQUE: Multiplanar, multisequence MR imaging of the lumbar spine was performed. No intravenous contrast was administered.   COMPARISON:  Spine radiographs 10/27/20   FINDINGS: Segmentation: 5 non rib-bearing lumbar type vertebral bodies are present. The lowest fully formed vertebral body is L5.   Alignment: Slight anterolisthesis is present at L4-5. No other significant listhesis or scoliosis is present.   Vertebrae: Mild chronic endplate marrow changes present at L3-4. Edematous changes are present about the facet joints at L4-5 bilaterally, left greater than right. Subchondral cysts are present about the joint at L4-5 on the left. Marrow signal and vertebral body heights are otherwise normal.   Conus medullaris and cauda equina: Conus extends to the L2 level. Conus and cauda equina appear normal.   Paraspinal and other soft tissues: Cystic changes are present in the kidneys bilaterally. No solid lesions are present. No significant adenopathy is present.   Disc levels:   T12-L1: Negative.   L1-2: Mild disc bulging facet hypertrophy is present without significant stenosis.   L2-3: Mild disc bulging and facet hypertrophy is present. No significant stenosis is present.   L3-4: Mild disc bulging and facet hypertrophy is present. In the setting of short pedicles this results in mild foraminal narrowing bilaterally.   L4-5: There is uncovering of a broad-based disc protrusion. Moderate facet hypertrophy is noted bilaterally. This results in mild subarticular narrowing bilaterally. Moderate foraminal stenosis is worse left than right.   L5-S1: Mild facet hypertrophy is present bilaterally. No significant stenosis is present.   IMPRESSION: 1. Multilevel spondylosis of the lumbar spine is  greatest at L4-5 where there is grade 1 anterolisthesis, uncovering of a broad-based disc protrusion, and moderate facet hypertrophy. 2. Mild subarticular narrowing bilaterally at L4-5. 3. Moderate foraminal stenosis bilaterally at L4-5 is worse on the left. 4. Mild foraminal narrowing bilaterally at L3-4 secondary to disc bulging, facet hypertrophy, and a congenital element of short pedicles. 5. Mild facet hypertrophy bilaterally at L5-S1 without significant stenosis.     Electronically Signed   By: San Morelle M.D.   On: 12/13/2020 15:26     Objective:  VS:  HT:     WT:    BMI:      BP:138/86   HR:94bpm   TEMP: ( )   RESP:  Physical Exam Vitals and nursing note reviewed.  Constitutional:      General: She is not in acute distress.    Appearance: Normal appearance. She is obese. She is not ill-appearing.  HENT:     Head: Normocephalic and atraumatic.     Right Ear: External ear normal.     Left Ear: External ear normal.  Eyes:     Extraocular Movements: Extraocular movements intact.  Cardiovascular:     Rate and Rhythm: Normal rate.     Pulses: Normal pulses.  Pulmonary:     Effort: Pulmonary effort is normal. No respiratory distress.  Abdominal:  General: There is no distension.     Palpations: Abdomen is soft.  Musculoskeletal:        General: Tenderness present.     Cervical back: Neck supple.     Right lower leg: No edema.     Left lower leg: No edema.     Comments: Patient has good distal strength with no pain over the greater trochanters.  No clonus or focal weakness. Patient somewhat slow to rise from a seated position to full extension.  There is concordant low back pain with facet loading and lumbar spine extension rotation.  There are no definitive trigger points but the patient is somewhat tender across the lower back and PSIS.  There is no pain with hip rotation.   Skin:    Findings: No erythema, lesion or rash.  Neurological:     General: No  focal deficit present.     Mental Status: She is alert and oriented to person, place, and time.     Sensory: No sensory deficit.     Motor: No weakness or abnormal muscle tone.     Coordination: Coordination normal.  Psychiatric:        Mood and Affect: Mood normal.        Behavior: Behavior normal.     Imaging: No results found.

## 2021-11-06 NOTE — Procedures (Signed)
Lumbar Diagnostic Facet Joint Nerve Block with Fluoroscopic Guidance   Patient: [name]      Date of Birth: 02-Mar-1966 MRN: [id number] PCP: Garnette Gunner, MD      Visit Date: 10/11/2021   Universal Protocol:    Date/Time: 02/04/236:45 AM  Consent Given By: the patient  Position: PRONE  Additional Comments: Vital signs were monitored before and after the procedure. Patient was prepped and draped in the usual sterile fashion. The correct patient, procedure, and site was verified.   Injection Procedure Details:   Procedure diagnoses:  1. Spondylosis without myelopathy or radiculopathy, lumbar region      Meds Administered:  Meds ordered this encounter  Medications   methylPREDNISolone acetate (DEPO-MEDROL) injection 80 mg     Laterality: Bilateral  Location/Site: L4-L5, L3 and L4 medial branches  Needle: 5.0 in., 25 ga.  Short bevel or Quincke spinal needle  Needle Placement: Oblique pedical  Findings:   -Comments: There was excellent flow of contrast along the articular pillars without intravascular flow.  Procedure Details: The fluoroscope beam is vertically oriented in AP and then obliqued 15 to 20 degrees to the ipsilateral side of the desired nerve to achieve the Scotty dog appearance.  The skin over the target area of the junction of the superior articulating process and the transverse process (sacral ala if blocking the L5 dorsal rami) was locally anesthetized with a 1 ml volume of 1% Lidocaine without Epinephrine.  The spinal needle was inserted and advanced in a trajectory view down to the target.   After contact with periosteum and negative aspirate for blood and CSF, correct placement without intravascular or epidural spread was confirmed by injecting 0.5 ml. of Isovue-250.  A spot radiograph was obtained of this image.    Next, a 0.5 ml. volume of the injectate described above was injected. The needle was then redirected to the other facet joint  nerves mentioned above if needed.  Prior to the procedure, the patient was given a Pain Diary which was completed for baseline measurements.  After the procedure, the patient rated their pain every 30 minutes and will continue rating at this frequency for a total of 5 hours.  The patient has been asked to complete the Diary and return to [place] by mail, fax or hand delivered as soon as possible.   Additional Comments:  No complications occurred Dressing: 2 x 2 sterile gauze and Band-Aid    Post-procedure details: Patient was observed during the procedure. Post-procedure instructions were reviewed.  Patient left the clinic in stable condition.

## 2021-11-25 ENCOUNTER — Other Ambulatory Visit (HOSPITAL_COMMUNITY): Payer: Self-pay

## 2021-11-25 ENCOUNTER — Encounter (HOSPITAL_COMMUNITY): Payer: Self-pay

## 2021-11-25 ENCOUNTER — Other Ambulatory Visit: Payer: Self-pay

## 2021-11-25 ENCOUNTER — Ambulatory Visit (HOSPITAL_COMMUNITY)
Admission: EM | Admit: 2021-11-25 | Discharge: 2021-11-25 | Disposition: A | Discharge status: Home / Self Care | Payer: 59 | Attending: Internal Medicine | Admitting: Internal Medicine

## 2021-11-25 DIAGNOSIS — M549 Dorsalgia, unspecified: Secondary | ICD-10-CM | POA: Diagnosis not present

## 2021-11-25 DIAGNOSIS — M170 Bilateral primary osteoarthritis of knee: Secondary | ICD-10-CM

## 2021-11-25 MED ORDER — KETOROLAC TROMETHAMINE 30 MG/ML IJ SOLN
30.0000 mg | Freq: Once | INTRAMUSCULAR | Status: AC
  Administered 2021-11-25: 30 mg via INTRAMUSCULAR

## 2021-11-25 MED ORDER — KETOROLAC TROMETHAMINE 30 MG/ML IJ SOLN
INTRAMUSCULAR | Status: AC
Start: 2021-11-25 — End: ?
  Filled 2021-11-25: qty 1

## 2021-11-25 MED ORDER — DICLOFENAC SODIUM 1 % EX GEL
4.0000 g | Freq: Four times a day (QID) | CUTANEOUS | 3 refills | Status: AC
  Filled 2021-11-25: qty 100, 7d supply, fill #0

## 2021-11-25 MED ORDER — MELOXICAM 7.5 MG PO TABS
7.5000 mg | ORAL_TABLET | Freq: Every day | ORAL | 1 refills | Status: DC
  Filled 2021-11-25: qty 30, 30d supply, fill #0

## 2021-11-25 MED ORDER — METHOCARBAMOL 500 MG PO TABS
500.0000 mg | ORAL_TABLET | Freq: Every day | ORAL | 0 refills | Status: DC
  Filled 2021-11-25: qty 30, 30d supply, fill #0

## 2021-11-25 NOTE — ED Provider Notes (Addendum)
[facility]    CSN: [id number] Arrival date & time: 11/25/21  1540      History   Chief Complaint Chief Complaint  Patient presents with   Right Side Pain    HPI [name] is a 56 y.o. female with a history of chronic back pain comes to the urgent care complaining of right upper back/right sided chest pain.  Pain started 3 days ago.  Pain is moderately severe and currently 6 out of 10.  Is associated with muscle tightness and cramps.  No trauma or falls.  Pain is worse when she sits in 1 position for a long time.  No difficulty breathing.  No calf pain.  No long distance travel.  No dysuria urgency or frequency.  No weight changes.  Patient has not tried over-the-counter medications at this time.  Patient has been extensively evaluated for back pain in the past.  She has been seen by sports medicine team.  HPI  Past Medical History:  Diagnosis Date   GERD (gastroesophageal reflux disease)    Knee pain    Lower back pain     Patient Active Problem List   Diagnosis Date Noted   Osteoarthritis of knees, bilateral 04/06/2021   Greater trochanteric bursitis of left hip 01/24/2018   Right shoulder pain 07/24/2016   Atypical chest pain 06/17/2016   Osteoarthritis of right knee 04/21/2016   Left shoulder pain 05/15/2014   Low back pain 12/25/2013   Right hip pain 11/29/2013   Headache(784.0) 01/22/2013   Hyperlipidemia LDL goal < 160 07/12/2012   Tendinopathy of rotator cuff, right 06/26/2012   Language barrier 12/08/2011   Ganglion cyst 12/08/2011   Obesity (BMI 30.0-34.9) 12/08/2011    Past Surgical History:  Procedure Laterality Date   DIRECT LARYNGOSCOPY N/A 07/12/2018   Procedure: DIRECT LARYNGOSCOPY;  Surgeon: Newman Pies, MD;  Location: [facility] OR;  Service: ENT;  Laterality: N/A;   knee injections     shoulder injections      OB History   No obstetric history on file.      Home Medications    Prior to Admission medications   Medication Sig Start  Date End Date Taking? Authorizing Provider  meloxicam (MOBIC) 7.5 MG tablet Take 1 tablet (7.5 mg total) by mouth daily. 11/25/21  Yes Jakavion Bilodeau, Britta Mccreedy, MD  methocarbamol (ROBAXIN) 500 MG tablet Take 1 tablet (500 mg total) by mouth at bedtime. 11/25/21  Yes Karanveer Ramakrishnan, Britta Mccreedy, MD  diclofenac Sodium (VOLTAREN) 1 % GEL Apply 4 g topically 4 (four) times daily. 11/25/21   Banner Huckaba, Britta Mccreedy, MD  gabapentin (NEURONTIN) 300 MG capsule Take 1 capsule (300 mg total) by mouth at bedtime. 04/11/19   Reino Bellis R, DO  lidocaine (LIDODERM) 5 % Place 1 patch onto the skin daily as needed. Apply patch to area most significant pain once per day.  Remove and discard patch within 12 hours of application. 06/08/21   Petrucelli, Samantha R, PA-C  Multiple Vitamin (MULTIVITAMIN) tablet Take 1 tablet by mouth daily. Doesn't take daily    [provider]  pantoprazole (PROTONIX) 40 MG tablet Take 40 mg by mouth daily.    [provider]  traMADol (ULTRAM) 50 MG tablet Take 1 tablet (50 mg total) by mouth every 6 (six) hours as needed. 05/18/21   Ralene Cork, DO    Family History Family History  Problem Relation Age of Onset   Sudden death Neg Hx    Hyperlipidemia Neg Hx  Heart attack Neg Hx    Diabetes Neg Hx    Hypertension Neg Hx    Heart disease Neg Hx    Colon cancer Neg Hx    Colon polyps Neg Hx    Esophageal cancer Neg Hx    Rectal cancer Neg Hx    Stomach cancer Neg Hx     Social History Social History   Tobacco Use   Smoking status: Never   Smokeless tobacco: Never  Vaping Use   Vaping Use: Never used  Substance Use Topics   Alcohol use: No   Drug use: No     Allergies   Patient has no known allergies.   Review of Systems Review of Systems  Gastrointestinal: Negative.   Musculoskeletal:  Positive for back pain. Negative for joint swelling and myalgias.  Skin: Negative.   Neurological: Negative.     Physical Exam Triage Vital Signs ED Triage Vitals   Enc Vitals Group     BP 11/25/21 0926 129/82     Pulse Rate 11/25/21 0924 91     Resp 11/25/21 0924 18     Temp 11/25/21 0924 98.4 F (36.9 C)     Temp Source 11/25/21 0924 Oral     SpO2 11/25/21 0924 99 %     Weight --      Height --      Head Circumference --      Peak Flow --      Pain Score 11/25/21 0925 8     Pain Loc --      Pain Edu? --      Excl. in GC? --    No data found.  Updated Vital Signs BP 129/82    Pulse 91    Temp 98.4 F (36.9 C) (Oral)    Resp 18    SpO2 99%   Visual Acuity Right Eye Distance:   Left Eye Distance:   Bilateral Distance:    Right Eye Near:   Left Eye Near:    Bilateral Near:     Physical Exam Vitals and nursing note reviewed.  Constitutional:      General: She is not in acute distress.    Appearance: Normal appearance. She is not ill-appearing.  Cardiovascular:     Rate and Rhythm: Normal rate and regular rhythm.  Pulmonary:     Effort: Pulmonary effort is normal.     Breath sounds: Normal breath sounds.  Musculoskeletal:        General: Tenderness present. No swelling or deformity. Normal range of motion.     Cervical back: Normal range of motion and neck supple. No rigidity or tenderness.  Skin:    General: Skin is warm.     Findings: No bruising or erythema.  Neurological:     Mental Status: She is alert.     UC Treatments / Results  Labs (all labs ordered are listed, but only abnormal results are displayed) Labs Reviewed - No data to display  EKG   Radiology No results found.  Procedures Procedures (including critical care time)  Medications Ordered in UC Medications  ketorolac (TORADOL) 30 MG/ML injection 30 mg (30 mg Intramuscular Given 11/25/21 1001)    Initial Impression / Assessment and Plan / UC Course  I have reviewed the triage vital signs and the nursing notes.  Pertinent labs & imaging results that were available during my care of the patient were reviewed by me and considered in my medical  decision making (see chart  for details).     Upper back pain on the right side: Toradol 30 mg IM x1 dose Robaxin as needed for muscle spasms-precautions given Diclofenac gel as needed for pain Meloxicam 7.5 mg orally daily Old medical records were reviewed. Follow-up with sports medicine if symptoms persist. Final Clinical Impressions(s) / UC Diagnoses   Final diagnoses:  Upper back pain on right side     Discharge Instructions      Please take medications as prescribed Gentle range of motion exercises Heating pad use only 20 minutes on-20 minutes off cycle If symptoms worsen please return to urgent care to be reevaluated.   ED Prescriptions     Medication Sig Dispense Auth. Provider   methocarbamol (ROBAXIN) 500 MG tablet Take 1 tablet (500 mg total) by mouth at bedtime. 30 tablet Yassmine Tamm, Britta Mccreedy, MD   diclofenac Sodium (VOLTAREN) 1 % GEL Apply 4 g topically 4 (four) times daily. 100 g Merrilee Jansky, MD   meloxicam (MOBIC) 7.5 MG tablet Take 1 tablet (7.5 mg total) by mouth daily. 30 tablet Le Ferraz, Britta Mccreedy, MD      PDMP not reviewed this encounter.   Merrilee Jansky, MD 11/25/21 1021    Merrilee Jansky, MD 11/25/21 1022

## 2021-11-25 NOTE — ED Triage Notes (Signed)
Pt presents with right side pain from right chest area down to RLQ abdomen that radiates around to right flank X 3 days.

## 2021-11-25 NOTE — Discharge Instructions (Addendum)
Please take medications as prescribed Gentle range of motion exercises Heating pad use only 20 minutes on-20 minutes off cycle If symptoms worsen please return to urgent care to be reevaluated.

## 2022-01-12 IMAGING — CR DG LUMBAR SPINE COMPLETE 4+V
5 series · 5 of 5 positions shown · non-contrast
Comparison: 10/27/2020

CLINICAL DATA: Right leg pain, back pain

EXAM:
LUMBAR SPINE - COMPLETE 4+ VIEW

[l-spine ap]
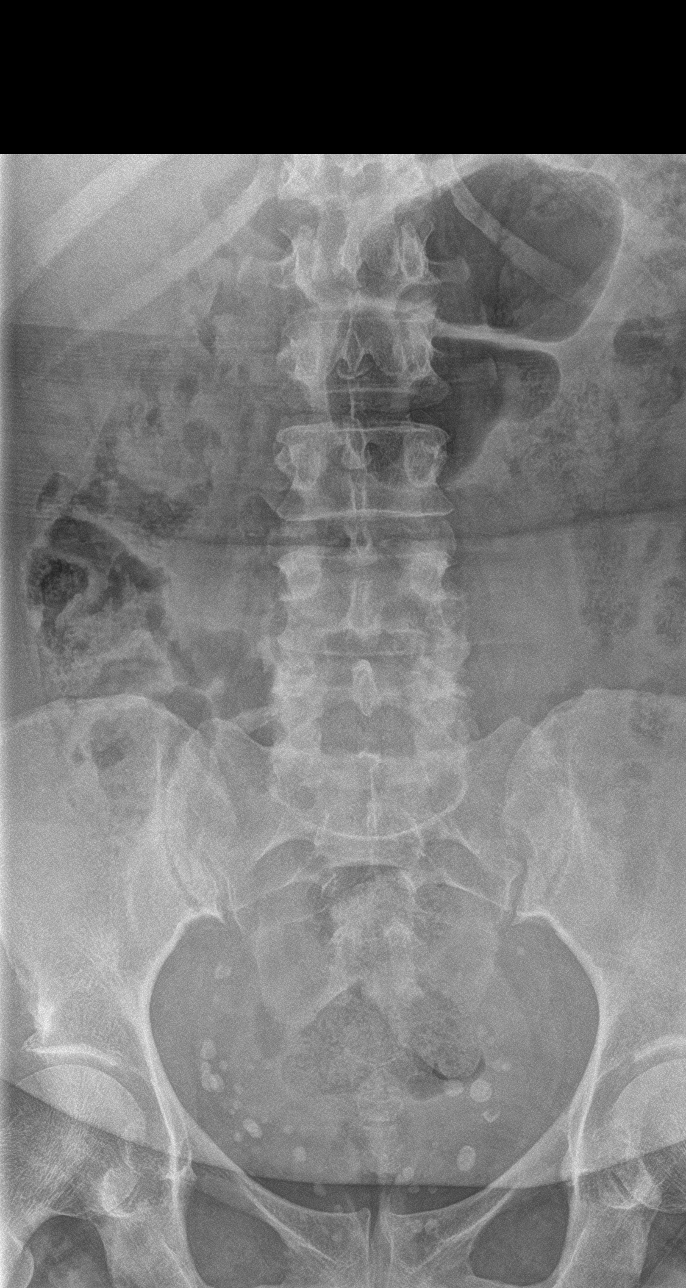

[l-spine obl (1 of 2)]
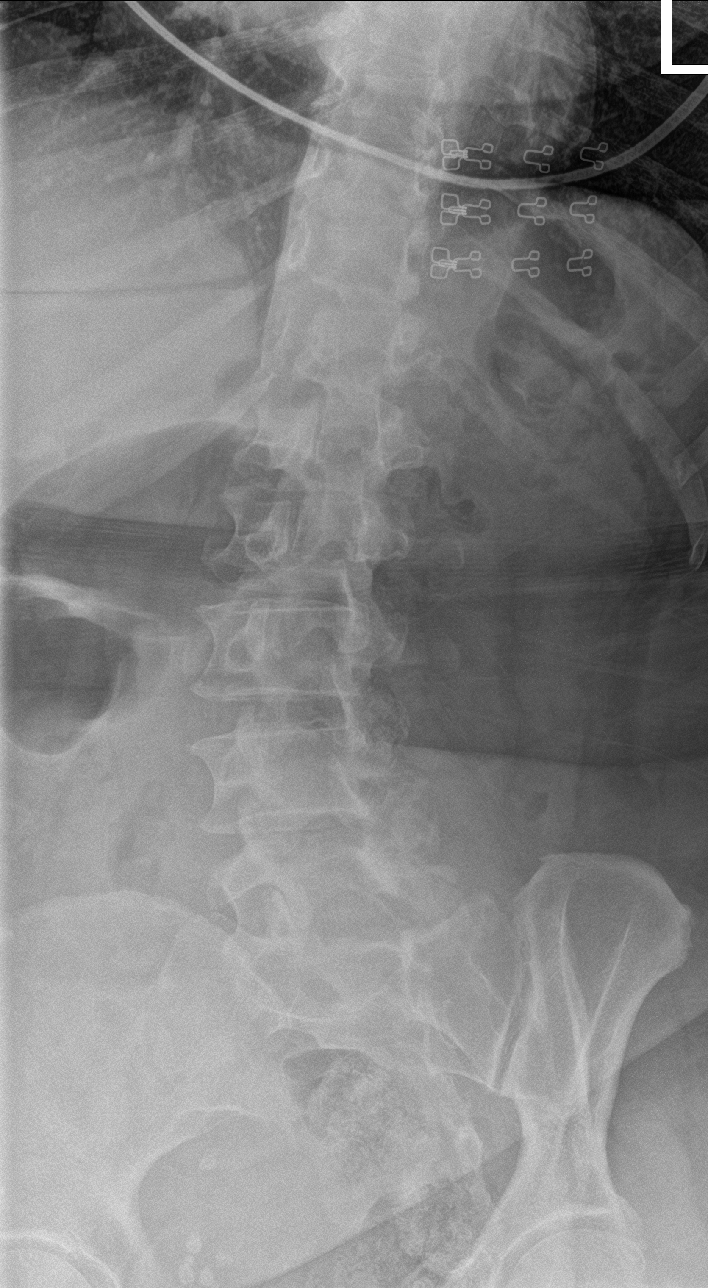

[l-spine obl (2 of 2)]
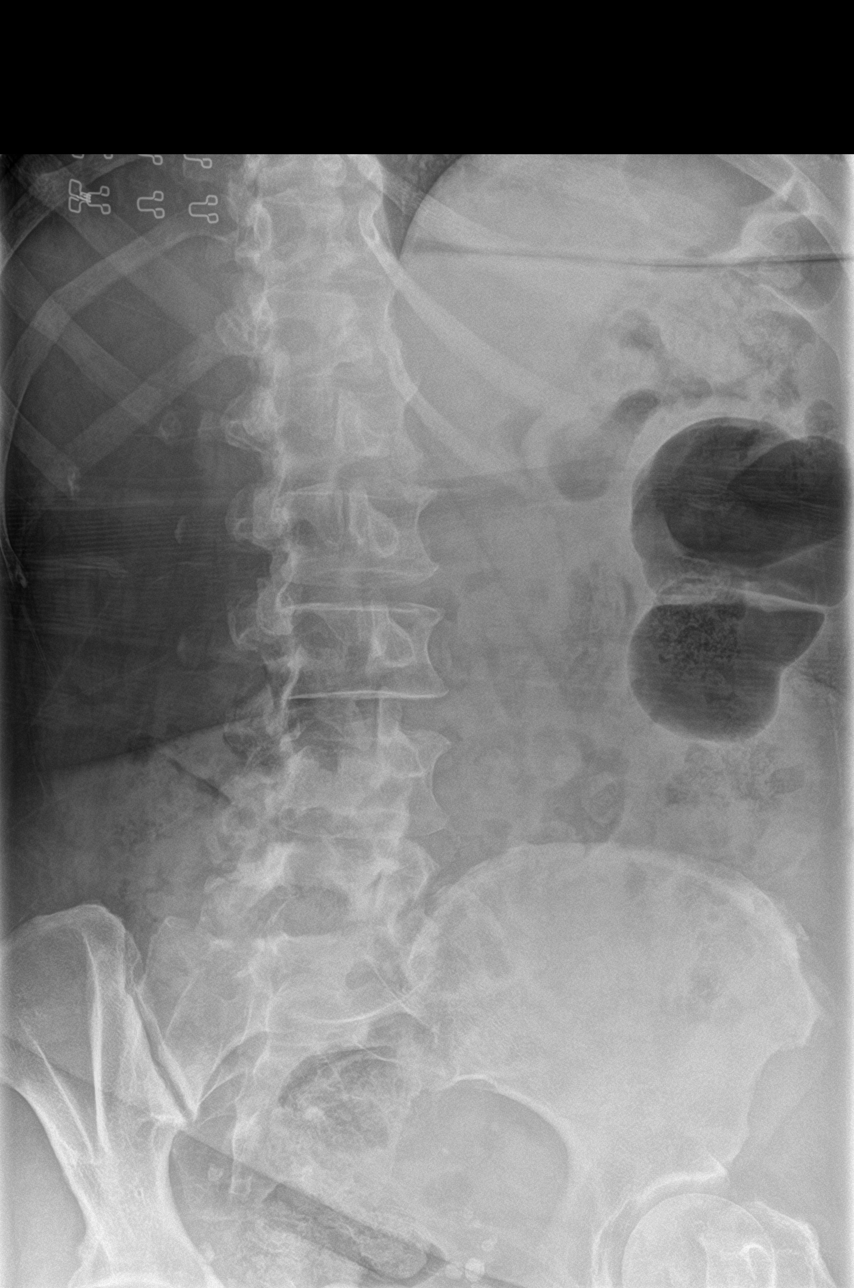

[l-spine lat]
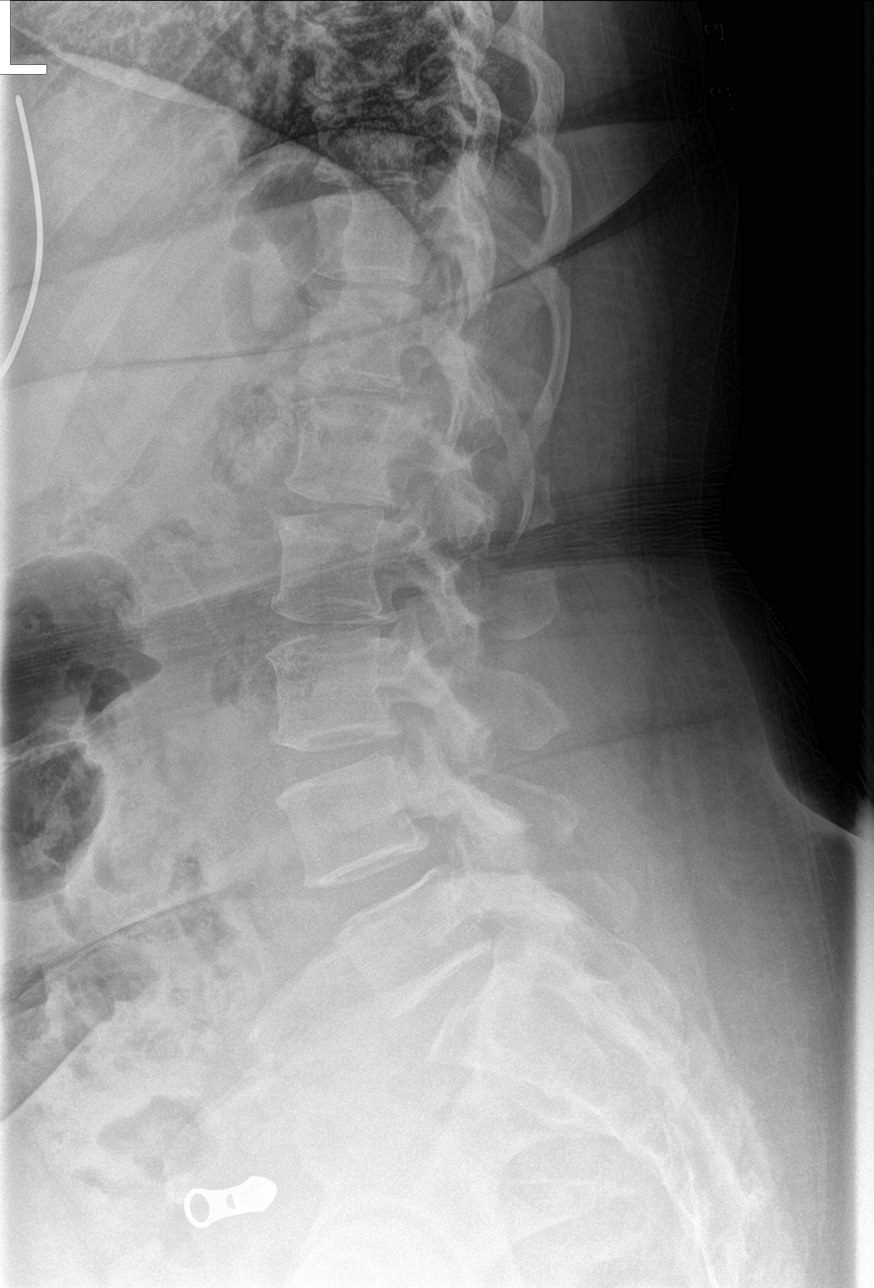

[l-spine spot]
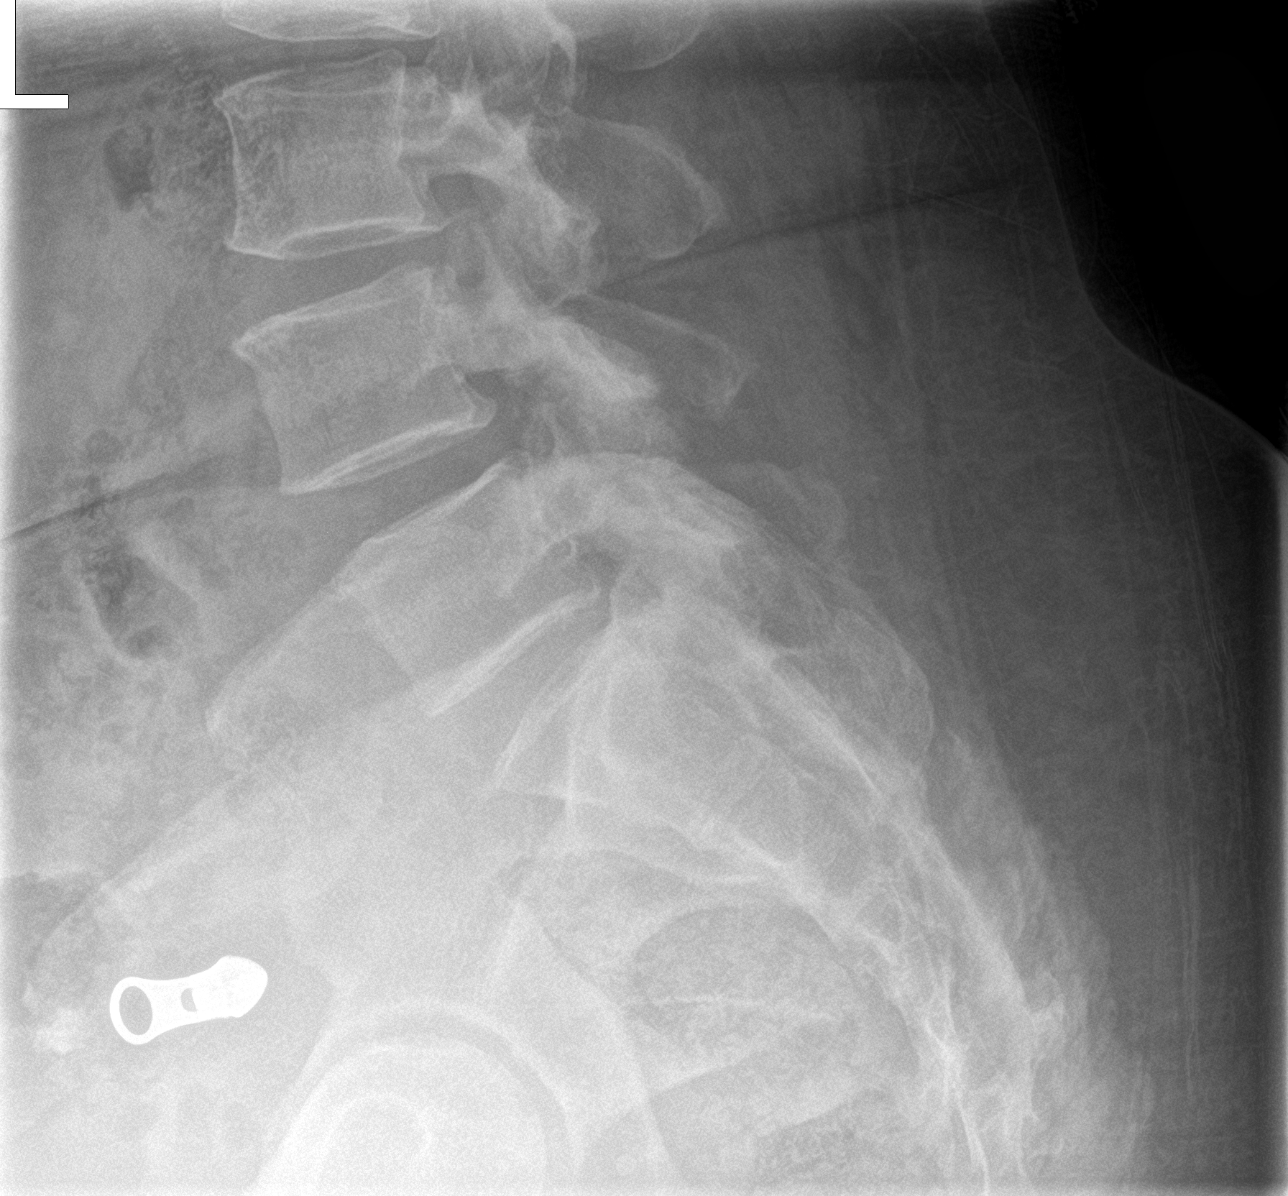

[5 of 5 positions shown; findings below may reference images not displayed]

FINDINGS: Degenerative facet disease in the mid and lower lumbar spine. Disc
spaces maintained. Normal alignment. No fracture. SI joints
symmetric and unremarkable.
IMPRESSION: Degenerative facet disease in the mid to lower lumbar spine.

No acute bony abnormality.

## 2022-05-31 ENCOUNTER — Ambulatory Visit (INDEPENDENT_AMBULATORY_CARE_PROVIDER_SITE_OTHER): Payer: Commercial Managed Care - HMO | Admitting: Sports Medicine

## 2022-05-31 ENCOUNTER — Other Ambulatory Visit (HOSPITAL_COMMUNITY): Payer: Self-pay

## 2022-05-31 DIAGNOSIS — M25512 Pain in left shoulder: Secondary | ICD-10-CM

## 2022-05-31 MED ORDER — DICLOFENAC SODIUM 75 MG PO TBEC
75.0000 mg | DELAYED_RELEASE_TABLET | Freq: Two times a day (BID) | ORAL | 0 refills | Status: AC
  Filled 2022-05-31: qty 60, 30d supply, fill #0

## 2022-06-01 NOTE — Progress Notes (Signed)
   Subjective:    Patient ID: [name], female    DOB: [birth year], 56 y.o.   MRN: [id number]  HPI chief complaint: Left shoulder pain  Patient presents today complaining of several weeks of lateral left shoulder pain.  No trauma that she can recall.  She has had pain similar to this in the past.  She has been taking 800 mg of ibuprofen which has been helpful but she was most recently in [place] where she took some of her brothers diclofenac.  This was extremely helpful and she is asking whether or not she can try this for her shoulder pain.  Pain is most noticeable with overhead reaching.  No numbness or tingling.    Review of Systems As above    Objective:   Physical Exam  Well-developed, well-nourished.  No acute distress  Left shoulder: Mcfarland range of motion.  Positive signs of impingement.  Mcfarland strength.  Neurovascular intact distally.      Assessment & Plan:   Left shoulder pain likely secondary to rotator cuff tendinopathy  Diclofenac 75 mg twice daily with food for the next 3 days then as needed.  We will also provide her with a note for her job limiting her lifting and carrying ability to less than 5 pounds for the next week.  We will also educate her and a rotator cuff home exercise program.  She will follow-up for ongoing or Issues.  This note was dictated using Dragon naturally speaking software and may contain errors in syntax, spelling, or content which have not been identified prior to signing this note.

## 2022-06-28 ENCOUNTER — Other Ambulatory Visit (HOSPITAL_COMMUNITY): Payer: Self-pay

## 2022-06-28 ENCOUNTER — Ambulatory Visit: Payer: Commercial Managed Care - HMO | Admitting: Sports Medicine

## 2022-06-28 ENCOUNTER — Ambulatory Visit (INDEPENDENT_AMBULATORY_CARE_PROVIDER_SITE_OTHER): Payer: Commercial Managed Care - HMO | Admitting: Sports Medicine

## 2022-06-28 ENCOUNTER — Encounter: Payer: Self-pay | Admitting: Sports Medicine

## 2022-06-28 VITALS — BP 127/82 | Ht 61.0 in

## 2022-06-28 DIAGNOSIS — M25512 Pain in left shoulder: Secondary | ICD-10-CM

## 2022-06-28 DIAGNOSIS — M25552 Pain in left hip: Secondary | ICD-10-CM | POA: Diagnosis not present

## 2022-06-28 MED ORDER — PREDNISONE 10 MG (21) PO TBPK
ORAL_TABLET | ORAL | 0 refills | Status: DC
  Filled 2022-06-28: qty 21, 6d supply, fill #0

## 2022-06-28 MED ORDER — METHYLPREDNISOLONE ACETATE 40 MG/ML IJ SUSP
40.0000 mg | Freq: Once | INTRAMUSCULAR | Status: AC
  Administered 2022-06-28: 40 mg via INTRA_ARTICULAR

## 2022-06-28 NOTE — Progress Notes (Signed)
   Subjective:    Patient ID: [name], female    DOB: 1966-08-03, 56 y.o.   MRN: [id number]  HPI  [name] presents today with a couple of different complaints.  She is still having lateral left shoulder pain despite starting diclofenac.  Pain is most noticeable when reaching directly overhead.  She does get some pain at night as well.  She is also experiencing some acute diffuse left hip pain that began without any inciting event a couple days ago.  Pain does not radiate past the knee.  No numbness or tingling.  She did have difficulty sleeping on this side last night.  She denies any low back pain.    Review of Systems As above    Objective:   Physical Exam  Well-developed, well-nourished.  No acute distress.  Sitting comfortably in the exam room  Left shoulder: She does have some limited active abduction and forward flexion with a positive painful arc.  Passive range of motion is good.  Positive to can, positive Hawkins.  Rotator cuff strength is 5/5 but does reproduce pain with resisted supraspinatus.  No tenderness over the bicipital groove.  Neurovascularly intact distally.  Left hip: Smooth painless hip range of motion with a negative logroll.  Negative straight leg raise.  Diffuse tenderness to palpation diffusely around the posterior lateral hip.      Assessment & Plan:   Persistent left shoulder pain likely secondary to rotator cuff tendinopathy Diffuse left hip pain  For the left shoulder I recommended a subacromial cortisone injection.  Patient agrees.  This is accomplished atraumatically under sterile technique.  She tolerates this without difficulty.  For the diffuse left hip pain, we will start a 6-day Sterapred Dosepak tomorrow.  She does have a history of multilevel spondylosis of the lumbar spine but she states that her current pain is different in nature than what she is experienced with that in the past.  She will follow-up with me again in 3 weeks for reevaluation.  If  left shoulder pain persists, consider imaging at that time.  This note was dictated using Dragon naturally speaking software and may contain errors in syntax, spelling, or content which have not been identified prior to signing this note.   Consent obtained and verified. Time-out conducted. Noted no overlying erythema, induration, or other signs of local infection. Skin prepped in a sterile fashion. Topical analgesic spray: Ethyl chloride. Joint: Left shoulder, subacromial Needle: 25-gauge 1.5 inch Completed without difficulty. Meds: 3 cc 1% Xylocaine, 1 cc (40 mg) Depo-Medrol  Advised to call if fevers/chills, erythema, induration, drainage, or persistent bleeding.

## 2022-06-28 NOTE — Patient Instructions (Signed)
Follow-up in 3 weeks

## 2022-07-19 ENCOUNTER — Ambulatory Visit: Payer: Commercial Managed Care - HMO | Admitting: Sports Medicine

## 2022-07-26 ENCOUNTER — Ambulatory Visit (INDEPENDENT_AMBULATORY_CARE_PROVIDER_SITE_OTHER): Payer: Commercial Managed Care - HMO | Admitting: Sports Medicine

## 2022-07-26 VITALS — BP 125/86 | Ht 61.0 in

## 2022-07-26 DIAGNOSIS — M47816 Spondylosis without myelopathy or radiculopathy, lumbar region: Secondary | ICD-10-CM | POA: Diagnosis not present

## 2022-07-26 NOTE — Patient Instructions (Signed)
Call Juliann Pulse at [facility] to sched your injection. [phone]

## 2022-07-26 NOTE — Progress Notes (Signed)
Patient ID: [name], female   DOB: July 24, 1966, 56 y.o.   MRN: [id number]  Patient presents today for follow-up on left shoulder and low back pain.  Left shoulder pain has improved with recent subacromial cortisone injection although she is not 100% pain-free.  Her main problem continues to be low back pain.  An MRI of her lumbar spine in March 2022 showed multilevel spondylosis greatest at L4-L5 where she has moderate facet arthropathy.  Her pain is diffuse across the low back with occasional radiating pain into the buttocks and posterior legs.  Her pain is most noticeable when ambulating or standing.  It does improve with sitting.  Physical exam was not repeated today.  We simply talked about treatment going forward.  At this point in time her shoulder pain is tolerable so we will not pursue further work-up or treatment here.  For her low back, I would like to refer her to [facility] imaging for diagnostic/therapeutic facet injections.  She is in agreement with that plan.  She will then follow-up with me 2 weeks after those injections for check on her progress.  She also has Voltaren to take as needed for pain.  This note was dictated using Dragon naturally speaking software and may contain errors in syntax, spelling, or content which have not been identified prior to signing this note.

## 2022-07-27 ENCOUNTER — Other Ambulatory Visit: Payer: Self-pay | Admitting: Sports Medicine

## 2022-07-28 ENCOUNTER — Other Ambulatory Visit: Payer: Self-pay | Admitting: Sports Medicine

## 2022-07-28 DIAGNOSIS — M47816 Spondylosis without myelopathy or radiculopathy, lumbar region: Secondary | ICD-10-CM

## 2022-07-28 NOTE — Addendum Note (Signed)
Addended by: Cyd Silence on: 07/28/2022 08:43 AM   Modules accepted: Orders

## 2022-08-15 ENCOUNTER — Other Ambulatory Visit (HOSPITAL_COMMUNITY)
Admission: RE | Admit: 2022-08-15 | Discharge: 2022-08-15 | Disposition: A | Discharge status: Home / Self Care | Payer: Commercial Managed Care - HMO | Source: Ambulatory Visit | Attending: Obstetrics & Gynecology | Admitting: Obstetrics & Gynecology

## 2022-08-15 ENCOUNTER — Other Ambulatory Visit: Payer: Self-pay | Admitting: Obstetrics & Gynecology

## 2022-08-15 ENCOUNTER — Encounter: Payer: Self-pay | Admitting: Obstetrics & Gynecology

## 2022-08-15 ENCOUNTER — Ambulatory Visit (INDEPENDENT_AMBULATORY_CARE_PROVIDER_SITE_OTHER): Payer: Commercial Managed Care - HMO | Admitting: Obstetrics & Gynecology

## 2022-08-15 VITALS — BP 128/80 | HR 90 | Resp 20 | Ht 60.83 in | Wt 227.0 lb

## 2022-08-15 DIAGNOSIS — Z01419 Encounter for gynecological examination (general) (routine) without abnormal findings: Secondary | ICD-10-CM | POA: Insufficient documentation

## 2022-08-15 DIAGNOSIS — Z6841 Body Mass Index (BMI) 40.0 and over, adult: Secondary | ICD-10-CM

## 2022-08-15 DIAGNOSIS — Z780 Asymptomatic menopausal state: Secondary | ICD-10-CM

## 2022-08-15 DIAGNOSIS — Z113 Encounter for screening for infections with a predominantly sexual mode of transmission: Secondary | ICD-10-CM

## 2022-08-15 DIAGNOSIS — Z1231 Encounter for screening mammogram for malignant neoplasm of breast: Secondary | ICD-10-CM

## 2022-08-15 DIAGNOSIS — Z23 Encounter for immunization: Secondary | ICD-10-CM

## 2022-08-15 NOTE — Progress Notes (Signed)
[name] 23-Oct-1965 [id number]   History:    56 y.o. G7P5A2L5 Divorced.  Children 56 yo and above.  RP:  New patient presenting for annual gyn exam   HPI: Postmenopause x 10 yrs, well on no HRT.  No PMB.  No pelvic pain.  Not sexually active currently.  Last Pap/HPV HR Neg in 05/2014.  Breasts normal. Mammo Neg 01/2018, [facility].  BD Normal in 12/2016.  Colono Neg 03/2017.  BMI 43.14. Health labs with Fam MD.  Past medical history,surgical history, family history and social history were all reviewed and documented in the EPIC chart.  Gynecologic History No LMP recorded. Patient is postmenopausal.  Obstetric History OB History  Gravida Para Term Preterm AB Living  7 5 5  0 2 5  SAB IAB Ectopic Multiple Live Births  2       5    # Outcome Date GA Lbr Len/2nd Weight Sex Delivery Anes PTL Lv  7 SAB           6 SAB           5 Term           4 Term           3 Term           2 Term           1 Term              ROS: A ROS was performed and pertinent positives and negatives are included in the history.  GENERAL: No fevers or chills. HEENT: No change in vision, no earache, sore throat or sinus congestion. NECK: No pain or stiffness. CARDIOVASCULAR: No chest pain or pressure. No palpitations. PULMONARY: No shortness of breath, cough or wheeze. GASTROINTESTINAL: No abdominal pain, nausea, vomiting or diarrhea, melena or bright red blood per rectum. GENITOURINARY: No urinary frequency, urgency, hesitancy or dysuria. MUSCULOSKELETAL: No joint or muscle pain, no back pain, no recent trauma. DERMATOLOGIC: No rash, no itching, no lesions. ENDOCRINE: No polyuria, polydipsia, no heat or cold intolerance. No recent change in weight. HEMATOLOGICAL: No anemia or easy bruising or bleeding. NEUROLOGIC: No headache, seizures, numbness, tingling or weakness. PSYCHIATRIC: No depression, no loss of interest in normal activity or change in sleep pattern.     Exam:   BP  128/80 (BP Location: Right Arm, Patient Position: Sitting)   Pulse 90   Resp 20   Ht 5' 0.83" (1.545 m)   Wt 227 lb (103 kg)   SpO2 96%   BMI 43.14 kg/m   Body mass index is 43.14 kg/m.  General appearance : Well developed well nourished female. No acute distress HEENT: Eyes: no retinal hemorrhage or exudates,  Neck supple, trachea midline, no carotid bruits, no thyroidmegaly Lungs: Clear to auscultation, no rhonchi or wheezes, or rib retractions  Heart: Regular rate and rhythm, no murmurs or gallops Breast:Examined in sitting and supine position were symmetrical in appearance, no palpable masses or tenderness,  no skin retraction, no nipple inversion, no nipple discharge, no skin discoloration, no axillary or supraclavicular lymphadenopathy Abdomen: no palpable masses or tenderness, no rebound or guarding Extremities: no edema or skin discoloration or tenderness  Pelvic: Vulva: Normal             Vagina: No gross lesions or discharge  Cervix: No gross lesions or discharge.  Pap reflex, Gono-Chlam done.  Uterus  AV, normal size, shape and consistency, non-tender  and mobile  Adnexa  Without masses or tenderness  Anus: Normal   Assessment/Plan:  56 y.o. female for annual exam   1. Encounter for routine gynecological examination with Papanicolaou smear of cervix Postmenopause x 10 yrs, well on no HRT.  No PMB.  No pelvic pain.  Not sexually active currently.  Last Pap/HPV HR Neg in 05/2014.  Breasts normal. Mammo Neg 01/2018, [facility].  BD Normal in 12/2016.  Colono Neg 03/2017.  BMI 43.14. Health labs with Fam MD. - Cytology - PAP( [place])  2. Postmenopause Postmenopause x 10 yrs, well on no HRT.  No PMB.  No pelvic pain.  Not sexually active currently.  BD Normal in 12/2016.   3. Screen for STD (sexually transmitted disease) - Pap with Gono-Chlam - HIV antibody (with reflex) - RPR - Hepatitis B Surface AntiGEN - Hepatitis C Antibody  4. Class 3  severe obesity due to excess calories without serious comorbidity with body mass index (BMI) of 40.0 to 44.9 in adult [facility])  Low calorie/carb diet.  Increase fitness activities.  Genia Del MD, 2:38 PM 08/15/2022

## 2022-08-16 LAB — RPR: RPR Ser Ql: NONREACTIVE

## 2022-08-16 LAB — HEPATITIS B SURFACE ANTIGEN: Hepatitis B Surface Ag: NONREACTIVE

## 2022-08-16 LAB — HIV ANTIBODY (ROUTINE TESTING W REFLEX): HIV 1&2 Ab, 4th Generation: NONREACTIVE

## 2022-08-16 LAB — HEPATITIS C ANTIBODY: Hepatitis C Ab: NONREACTIVE

## 2022-08-17 ENCOUNTER — Ambulatory Visit
Admission: RE | Admit: 2022-08-17 | Discharge: 2022-08-17 | Disposition: A | Discharge status: Home / Self Care | Payer: Commercial Managed Care - HMO | Source: Ambulatory Visit | Attending: Sports Medicine | Admitting: Sports Medicine

## 2022-08-17 ENCOUNTER — Other Ambulatory Visit: Payer: Self-pay | Admitting: Sports Medicine

## 2022-08-17 DIAGNOSIS — M47816 Spondylosis without myelopathy or radiculopathy, lumbar region: Secondary | ICD-10-CM

## 2022-08-17 MED ORDER — IOPAMIDOL (ISOVUE-M 200) INJECTION 41%
1.0000 mL | Freq: Once | INTRAMUSCULAR | Status: AC
  Administered 2022-08-17: 1 mL via INTRA_ARTICULAR

## 2022-08-17 MED ORDER — METHYLPREDNISOLONE ACETATE 40 MG/ML INJ SUSP (RADIOLOG
80.0000 mg | Freq: Once | INTRAMUSCULAR | Status: AC
  Administered 2022-08-17: 80 mg via INTRA_ARTICULAR

## 2022-08-17 NOTE — Discharge Instructions (Signed)

## 2022-08-18 LAB — CYTOLOGY - PAP
Chlamydia: NEGATIVE
Comment: NEGATIVE
Comment: NORMAL
Diagnosis: NEGATIVE
Diagnosis: REACTIVE
Neisseria Gonorrhea: NEGATIVE

## 2022-09-29 ENCOUNTER — Emergency Department (HOSPITAL_COMMUNITY): Payer: Commercial Managed Care - HMO

## 2022-09-29 ENCOUNTER — Encounter (HOSPITAL_COMMUNITY): Payer: Self-pay

## 2022-09-29 ENCOUNTER — Other Ambulatory Visit: Payer: Self-pay

## 2022-09-29 ENCOUNTER — Emergency Department (HOSPITAL_COMMUNITY)
Admission: EM | Admit: 2022-09-29 | Discharge: 2022-09-30 | Disposition: A | Discharge status: Home / Self Care | Payer: Commercial Managed Care - HMO | Attending: Emergency Medicine | Admitting: Emergency Medicine

## 2022-09-29 DIAGNOSIS — M25551 Pain in right hip: Secondary | ICD-10-CM | POA: Diagnosis present

## 2022-09-29 DIAGNOSIS — M533 Sacrococcygeal disorders, not elsewhere classified: Secondary | ICD-10-CM | POA: Insufficient documentation

## 2022-09-29 MED ORDER — HYDROCODONE-ACETAMINOPHEN 5-325 MG PO TABS
1.0000 | ORAL_TABLET | Freq: Once | ORAL | Status: AC
  Administered 2022-09-29: 1 via ORAL
  Filled 2022-09-29: qty 1

## 2022-09-29 MED ORDER — LIDOCAINE 5 % EX PTCH
1.0000 | MEDICATED_PATCH | CUTANEOUS | Status: DC
  Administered 2022-09-29: 1 via TRANSDERMAL
  Filled 2022-09-29 (×2): qty 1

## 2022-09-29 NOTE — ED Provider Triage Note (Signed)
Emergency Medicine Provider Triage Evaluation Note  Kamarah Bilotta , a 56 y.o. female  was evaluated in triage.  Pt complains of right hip pain for the last day, where she has been unable to ambulate on her leg secondary to the pain.  Denies any recent trauma.  History of chronic back issues.  Hurts when she moves.  Review of Systems  Positive: R hip pain Negative: Color changes  Physical Exam  BP 134/88 (BP Location: Left Arm)   Pulse 87   Temp 98.4 F (36.9 C)   Resp 16   SpO2 98%  Gen:   Awake, no distress   Resp:  Normal effort  MSK:   Moves extremities without difficulty  Other:  +SI jt ttp of R side, and +SLR of right leg  Medical Decision Making  Medically screening exam initiated at 6:38 PM.  Appropriate orders placed.  Gwyneth Carnathan was informed that the remainder of the evaluation will be completed by another provider, this initial triage assessment does not replace that evaluation, and the importance of remaining in the ED until their evaluation is complete.    Pete Pelt, [place] 09/29/22 1839

## 2022-09-29 NOTE — ED Triage Notes (Signed)
Pt reports right hip pain that radiates down her right leg. Denies injury. Right hip is tender to the touch.

## 2022-09-30 ENCOUNTER — Other Ambulatory Visit (HOSPITAL_COMMUNITY): Payer: Self-pay

## 2022-09-30 ENCOUNTER — Telehealth: Payer: Self-pay

## 2022-09-30 MED ORDER — METHOCARBAMOL 500 MG PO TABS
500.0000 mg | ORAL_TABLET | Freq: Two times a day (BID) | ORAL | 0 refills | Status: AC
  Filled 2022-09-30: qty 20, 10d supply, fill #0

## 2022-09-30 MED ORDER — MELOXICAM 7.5 MG PO TABS
7.5000 mg | ORAL_TABLET | Freq: Every day | ORAL | 0 refills | Status: DC
  Filled 2022-09-30: qty 15, 15d supply, fill #0

## 2022-09-30 MED ORDER — KETOROLAC TROMETHAMINE 30 MG/ML IJ SOLN
30.0000 mg | Freq: Once | INTRAMUSCULAR | Status: AC
  Administered 2022-09-30: 30 mg via INTRAMUSCULAR
  Filled 2022-09-30: qty 1

## 2022-09-30 NOTE — ED Provider Notes (Signed)
[facility] EMERGENCY DEPARTMENT Provider Note   CSN: [id number] Arrival date & time: 09/29/22  1635     History  Chief Complaint  Patient presents with   Hip Pain   Leg Pain    [name] is a 56 y.o. female with medical history of chronic low back pain, bilateral knee arthritis, GERD.  Patient presents to ED for evaluation of right-sided hip pain.  Patient reports over the last 2 days she has had right-sided hip pain that has been constant.  Patient reports that the hip pain began while taking ibuprofen.  Patient denies any trauma, event to account for this increased pain.  The patient states she has a history of hip pain bilaterally however this hip pain has been worse recently.  Patient states she has been taking ibuprofen at home for pain relief.  Patient denies any fevers, nausea, vomiting, overlying skin changes to right hip.   Hip Pain  Leg Pain Associated symptoms: no fever        Home Medications Prior to Admission medications   Medication Sig Start Date End Date Taking? Authorizing Provider  meloxicam (MOBIC) 7.5 MG tablet Take 1 tablet (7.5 mg total) by mouth daily. 09/30/22  Yes Azucena Cecil, PA-C  methocarbamol (ROBAXIN) 500 MG tablet Take 1 tablet (500 mg total) by mouth 2 (two) times daily. 09/30/22  Yes Azucena Cecil, PA-C  diclofenac (VOLTAREN) 75 MG EC tablet Take 1 tablet (75 mg total) by mouth 2 (two) times daily with food 05/31/22   Lilia Argue R, DO  diclofenac Sodium (VOLTAREN) 1 % GEL Apply 4 g topically 4 (four) times daily. 11/25/21   Lamptey, Myrene Galas, MD  gabapentin (NEURONTIN) 300 MG capsule Take 1 capsule (300 mg total) by mouth at bedtime. Patient not taking: Reported on 08/15/2022 04/11/19   Thurman Coyer, DO  Multiple Vitamin (MULTIVITAMIN) tablet Take 1 tablet by mouth daily. Doesn't take daily    [provider]  pantoprazole (PROTONIX) 40 MG tablet Take 40 mg by mouth daily.    [provider]      Allergies    Patient has no known allergies.    Review of Systems   Review of Systems  Constitutional:  Negative for fever.  Gastrointestinal:  Negative for nausea and vomiting.  Musculoskeletal:  Positive for arthralgias.  Skin:  Negative for color change.  All other systems reviewed and are negative.   Physical Exam Updated Vital Signs BP (!) 143/84 (BP Location: Right Arm)   Pulse 87   Temp 97.8 F (36.6 C)   Resp 16   SpO2 100%  Physical Exam Vitals and nursing note reviewed.  Constitutional:      General: She is not in acute distress.    Appearance: She is well-developed.  HENT:     Head: Normocephalic and atraumatic.     Mouth/Throat:     Mouth: Mucous membranes are moist.     Pharynx: Oropharynx is clear.  Eyes:     Conjunctiva/sclera: Conjunctivae normal.  Cardiovascular:     Rate and Rhythm: Normal rate and regular rhythm.     Heart sounds: No murmur heard. Pulmonary:     Effort: Pulmonary effort is normal. No respiratory distress.     Breath sounds: Normal breath sounds.  Abdominal:     General: Abdomen is flat. Bowel sounds are normal.     Palpations: Abdomen is soft.     Tenderness: There is no abdominal tenderness.  Musculoskeletal:        General: No swelling.     Cervical back: Neck supple.     Comments: SI joint TTP.  Positive straight leg raise test on right.  No overlying skin change patient right hip  Patient able to appropriately range hip.  Skin:    General: Skin is warm and dry.     Capillary Refill: Capillary refill takes less than 2 seconds.  Neurological:     Mental Status: She is alert and oriented to person, place, and time.  Psychiatric:        Mood and Affect: Mood normal.     ED Results / Procedures / Treatments   Labs (all labs ordered are listed, but only abnormal results are displayed) Labs Reviewed - No data to display  EKG None  Radiology DG Lumbar Spine 2-3 Views  Result Date:  09/29/2022 CLINICAL DATA:  Right hip pain and right leg pain EXAM: LUMBAR SPINE - 2-3 VIEW COMPARISON:  06/08/2021 FINDINGS: Degenerative facet disease in the mid and lower lumbar spine, unchanged since prior study. Disc spaces maintained. Normal alignment. No fracture. SI joints symmetric and unremarkable. IMPRESSION: Degenerative facet disease.  No acute bony abnormality. Electronically Signed   By: Rolm Baptise M.D.   On: 09/29/2022 19:19   DG Hip Unilat W or Wo Pelvis 2-3 Views Right  Result Date: 09/29/2022 CLINICAL DATA:  Right hip pain EXAM: DG HIP (WITH OR WITHOUT PELVIS) 2-3V RIGHT COMPARISON:  None Available. FINDINGS: Hip joints and SI joints symmetric. No acute bony abnormality. Specifically, no fracture, subluxation, or dislocation. IMPRESSION: No acute bony abnormality. Electronically Signed   By: Rolm Baptise M.D.   On: 09/29/2022 19:18    Procedures Procedures   Medications Ordered in ED Medications  lidocaine (LIDODERM) 5 % 1 patch (1 patch Transdermal Patch Applied 09/29/22 1844)  HYDROcodone-acetaminophen (NORCO/VICODIN) 5-325 MG per tablet 1 tablet (1 tablet Oral Given 09/29/22 1844)  ketorolac (TORADOL) 30 MG/ML injection 30 mg (30 mg Intramuscular Given 09/30/22 0820)    ED Course/ Medical Decision Making/ A&P                           Medical Decision Making Risk Prescription drug management.   56 year old female presents to ED for evaluation of right hip pain.  Please see HPI for further details.  On examination the patient right hip has no overlying skin change.  Patient has intact range of motion to right hip.  No crepitus noted.  Patient plain film imaging of right hip as well as lumbar spine shows no acute pathology.  The patient has a history of chronic low back pain as well as left-sided hip pain.  Patient has history of arthritis.  Patient denies any trauma to account for increased pain.  Most likely pain is secondary to arthritis.  The patient will be  discharged home with meloxicam and muscle relaxer.  The patient will be advised to follow back up with sports medicine team for ongoing management.  Patient voiced understanding of my instructions.  Patient had all of her questions answered her satisfaction.  The patient is stable at this time for discharge home.  Final Clinical Impression(s) / ED Diagnoses Final diagnoses:  Right hip pain    Rx / DC Orders ED Discharge Orders          Ordered    methocarbamol (ROBAXIN) 500 MG tablet  2 times daily  09/30/22 0928    meloxicam (MOBIC) 7.5 MG tablet  Daily        09/30/22 0928              Al Decant, PA-C 09/30/22 9295    Tegeler, Canary Brim, MD 09/30/22 1450

## 2022-09-30 NOTE — Telephone Encounter (Signed)
Transition Care Management Unsuccessful Follow-up Telephone Call  Date of discharge and from where:  09/30/22 [facility] ED. Dx: Right hip pain  Attempts:  1st Attempt  Reason for unsuccessful TCM follow-up call:  Left voice message

## 2022-09-30 NOTE — Discharge Instructions (Signed)
Return to the ED with any new or worsening signs or symptoms such as fevers Please follow-up with sports medicine doctor that you were seen by originally Please begin taking meloxicam as prescribed Please begin taking muscle relaxer as prescribed.  Please be aware that this medication will cause her to become drowsy.

## 2022-10-05 NOTE — Telephone Encounter (Signed)
Transition Care Management Unsuccessful Follow-up Telephone Call  Date of discharge and from where:  09/30/22 [facility] ED. Dx: Right hip pain    Attempts:  2nd Attempt  Reason for unsuccessful TCM follow-up call:  Left voice message

## 2022-10-10 ENCOUNTER — Other Ambulatory Visit (HOSPITAL_COMMUNITY): Payer: Self-pay

## 2022-10-10 ENCOUNTER — Ambulatory Visit (INDEPENDENT_AMBULATORY_CARE_PROVIDER_SITE_OTHER): Payer: Self-pay | Admitting: Sports Medicine

## 2022-10-10 VITALS — BP 128/85 | Ht 61.0 in | Wt 227.0 lb

## 2022-10-10 DIAGNOSIS — M47896 Other spondylosis, lumbar region: Secondary | ICD-10-CM

## 2022-10-10 DIAGNOSIS — M25551 Pain in right hip: Secondary | ICD-10-CM

## 2022-10-10 MED ORDER — PREDNISONE 10 MG PO TABS
ORAL_TABLET | ORAL | 0 refills | Status: AC
  Filled 2022-10-10: qty 21, 6d supply, fill #0

## 2022-10-10 NOTE — Progress Notes (Signed)
.  Subjective:   HPI: Patient is a 57 y.o. female here for right hip pain and bilateral knee pain.  Hip Pain Patient reports long-time history of left hip and back pain but after Christmas she had a flare of right hip and back pain and went to the ER. There she was given Meloxicam and Robaxin, which have not really helped her symptoms much. She reports it is a sharp pain in her right lateral hip that will go to her back and that limits her walking.  Knee Pain Patient notes long history of knee pain and osteoarthritis. She uses lace-up braces for the knees that allows her to do her job but if she doesn't wear them she has difficulty with ambulation. Per chart review, patient was seen by Dr. Marlou Sa in 2021 and noted to have end-stage arthritis of the left knee and was recommended for TKA but wants to put off surgery.   PMH, medications, surgical history, social history, family history and allergies reviewed.     Objective:  Physical Exam: BP 128/85   Ht 5\' 1"  (1.549 m)   Wt 227 lb (103 kg)   BMI 42.89 kg/m  Gen: awake, alert, NAD, comfortable in exam room Pulm: breathing unlabored  Lumbar spine: - Inspection: no gross deformity or asymmetry, swelling or ecchymosis - Palpation: TTP of bilateral SI joings. No TTP over the spinous processes, paraspinal muscles - ROM: full active ROM of the lumbar spine - Strength: 5/5 strength of lower extremity in L4-S1 nerve root distributions b/l - Neuro: sensation intact in the L4-S1 nerve root distribution b/l - Special testing: Positive FABER of the right hip  Right Hip:  - Inspection: No gross deformity, no swelling, erythema, or ecchymosis - Palpation: TTP over the greater trochanter - ROM: Decreased ROM of the right hip secondary to pain - Strength: 4/5 strength with hip flexion and abduction. 5/5 strength of left hip - Neuro/vasc: NV intact distally bilaterally  - Special Tests: Positive FABER of the right hip, negative for left  hip  Bilateral knees - Inspection: no gross deformity or swelling - Palpation: slightly TTP along the joint line and infrapatellar region of the knees - ROM: Pain with extremes of knee extension - Strength: 5/5 strength of BLE   Assessment & Plan:  1. Greater trochanteric bursitis Physical exam and history consistent with bursitis. Hip x-rays from the ER reviewed were normal. Given that patient has significant arthropathy in her knees and lumbar spine that are causing her limited functionality, feel that giving a taper of oral steroids would be the best course of action. Discussed PT with patient, who is slightly reluctant but will decide when they try to schedule.  - Steroid taper x6 days, patient given instructions -  PT referral   2. Lumbar Facet Arthropathy Patient did not have improvement with facet injections. X-rays in the ER showed degenerative facet disease without acute abnormality. Feel that oral steroid taper would best address all current issues.  3. Bilateral Knee OA Patient with increasing pain but able to tolerate working if wearing knee braces. Previously seen by orthopedics but continues to defer surgical intervention. Steroids and physical therapy as above.    Rise Patience, DO PGY-3 [facility]  Patient seen and evaluated with the resident.  I agree with the above plan of care.  6-day Sterapred Dosepak to be taken as directed.  I also recommend formal physical therapy.  She may wean to a home exercise program per the therapist  discretion and she will follow-up with me as needed.

## 2022-10-14 ENCOUNTER — Ambulatory Visit
Admission: RE | Admit: 2022-10-14 | Discharge: 2022-10-14 | Disposition: A | Discharge status: Home / Self Care | Payer: Commercial Managed Care - HMO | Source: Ambulatory Visit | Attending: Obstetrics & Gynecology | Admitting: Obstetrics & Gynecology

## 2022-10-14 DIAGNOSIS — Z1231 Encounter for screening mammogram for malignant neoplasm of breast: Secondary | ICD-10-CM

## 2022-10-18 NOTE — Telephone Encounter (Signed)
Transition Care Management Unsuccessful Follow-up Telephone Call  Date of discharge and from where:  09/30/22 [facility] ED. Dx: Right hip pain   Attempts:  3rd Attempt  Reason for unsuccessful TCM follow-up call:  Left voice message

## 2023-05-09 ENCOUNTER — Ambulatory Visit (INDEPENDENT_AMBULATORY_CARE_PROVIDER_SITE_OTHER): Payer: Self-pay | Admitting: Family Medicine

## 2023-05-09 ENCOUNTER — Encounter: Payer: Self-pay | Admitting: Family Medicine

## 2023-05-09 ENCOUNTER — Other Ambulatory Visit (HOSPITAL_COMMUNITY): Payer: Self-pay

## 2023-05-09 ENCOUNTER — Other Ambulatory Visit: Payer: Self-pay

## 2023-05-09 VITALS — BP 122/82 | Ht 62.0 in | Wt 220.0 lb

## 2023-05-09 DIAGNOSIS — M25511 Pain in right shoulder: Secondary | ICD-10-CM

## 2023-05-09 MED ORDER — MELOXICAM 15 MG PO TABS
15.0000 mg | ORAL_TABLET | Freq: Every day | ORAL | 2 refills | Status: AC
  Filled 2023-05-09: qty 30, 30d supply, fill #0

## 2023-05-09 NOTE — Patient Instructions (Addendum)
Please take the meloxicam daily for the next 2 weeks, after that you may take it as needed.

## 2023-05-09 NOTE — Progress Notes (Signed)
PCP: No primary care provider on file.  Subjective:   HPI: Patient is a 57 y.o. female here for Right-sided shoulder pain.  Patient has never had right-sided shoulder pain in the past.  Patient states that on Friday of last week she was resting her arm behind her head while she was laying on the couch.  Patient states that she felt some pain and try to bring her arm forward and had some pain bring it forward as well.  Patient states that since then she has been having some difficulty raising her right arm.  Patient states that the pain on Friday of last week was 10 out of 10 but states that it slightly improved.  Patient states that she has been taking Advil for the pain and there has been some mild improvement.  Patient denies any numbness tingling or any weakness of her right arm.  Patient does note that the pain feels like it radiates near her trapezius into her shoulder.  Patient otherwise denies any previous trauma to the area.  No other concerns at this time.  Past Medical History:  Diagnosis Date   GERD (gastroesophageal reflux disease)    Knee pain    Lower back pain     Current Outpatient Medications on File Prior to Visit  Medication Sig Dispense Refill   diclofenac (VOLTAREN) 75 MG EC tablet Take 1 tablet (75 mg total) by mouth 2 (two) times daily with food 60 tablet 0   diclofenac Sodium (VOLTAREN) 1 % GEL Apply 4 g topically 4 (four) times daily. 100 g 3   gabapentin (NEURONTIN) 300 MG capsule Take 1 capsule (300 mg total) by mouth at bedtime. (Patient not taking: Reported on 08/15/2022) 30 capsule 1   methocarbamol (ROBAXIN) 500 MG tablet Take 1 tablet (500 mg total) by mouth 2 (two) times daily. 20 tablet 0   Multiple Vitamin (MULTIVITAMIN) tablet Take 1 tablet by mouth daily. Doesn't take daily     pantoprazole (PROTONIX) 40 MG tablet Take 40 mg by mouth daily.     predniSONE (DELTASONE) 10 MG tablet Use as directed per doctors orders for the next 6 days. 21 tablet 0   No  current facility-administered medications on file prior to visit.    Past Surgical History:  Procedure Laterality Date   DIRECT LARYNGOSCOPY N/A 07/12/2018   Procedure: DIRECT LARYNGOSCOPY;  Surgeon: Newman Pies, MD;  Location: [facility] OR;  Service: ENT;  Laterality: N/A;   knee injections     shoulder injections      No Known Allergies  BP 122/82   Ht 5\' 2"  (1.575 m)   Wt 220 lb (99.8 kg)   BMI 40.24 kg/m       No data to display              No data to display              Objective:  Physical Exam:   Shoulder, Right: No skin changes, erythema, or ecchymosis noted. No evidence of bony deformity, asymmetry, or muscle atrophy; Mild tenderness over long head of biceps (bicipital groove). No TTP at [facility] joint. Decreased range of motion with external rotation as well as abduction, Strength 5/5 throughout though pain is noted against active resistance with abduction and external rotation. Sensation to light touch intact. Peripheral pulses intact.   Special Tests:   - Painful Arc present 70 to 100 degrees   - Empty can: NEG   - Int/Ext Rotation test: External rotation  positive, INT negative   - Crossarm Adduction test: NEG   - Hawkins: NEG   - Neer test: Positive   - O'brien's test: NEG   - Speeds test: NEG    Gen: NAD, comfortable in exam room   MSK Complete US of Shoulder, Right  Patient was seated on exam table and shoulder US examination was performed using high frequency linear probe. The biceps tendon was visualized within the bicipital groove in both longitudinal and transverse axis with no signs of fluid or hypoechoic changes. Tendon fibers intact without signs of irregularity. The subscapularis tendon was visualized and appeared intact. No signs of tear, no hypoechoic changes or tissue irregularity were seen.. The supraspinatus tendon was poorly visualized given patients inability to move her arm into the crass or modified crass positions. Longitudinal, transverse,  and dynamic views showed no signs of tear with the tendon inserting at the superior facet of the greater tubercle of the humerus, but hypoechoic changes were noted as well as tissue irregularity throughout the tendon. The infraspinatus was visualized in both longitudinal and transverse axis with tendon insertion at the middle facet of the great tubercle of the humerus with no signs of tearing. Hypoechoic changes and tissue irregularity seen of the infraspinatus as well. [facility] Joint was visualized without bursal distension or significant bony spurs/arthritic changes.   IMPRESSION: Hypoechoic changes and thickening of the infraspinatus tendon consistent with tendinopathy.  Similar changes noted for the supraspinatus. Rest of exam grossly normal    Assessment & Plan:  1. Acute pain of right shoulder  - Patients physical exam and ultrasound are consistent with rotator cuff pathology.  Patient likely has infraspinatus versus supraspinatus tendinopathy and would benefit from strengthening exercises as well as anti-inflammatories at this time.  Would recommend meloxicam daily for the next 2 weeks, patient can use medication as needed afterwards.  Would also recommend patient see physical therapy.  Patient is agreeable with plan and will follow-up if there is no improvement. - [place] COMPLETE JOINT SPACE STRUCTURES UP RIGHT; Future - meloxicam (MOBIC) 15 MG tablet; Take 1 tablet (15 mg total) by mouth daily.  Dispense: 30 tablet; Refill: 2 - Ambulatory referral to Physical Therapy   Brenton Grills MD, PGY-4  Sports Medicine Fellow [facility]

## 2023-05-22 NOTE — Therapy (Signed)
OUTPATIENT PHYSICAL THERAPY UPPER EXTREMITY EVALUATION   Patient Name: [name] MRN: [id number] DOB:01-03-1966, 57 y.o., female Today's Date: 05/24/2023  END OF SESSION:  PT End of Session - 05/24/23 0806     Visit Number 1    Number of Visits 1    Date for PT Re-Evaluation 05/24/23    Authorization Type [employer] [facility]    PT Start Time 0800    PT Stop Time 0840    PT Time Calculation (min) 40 min    Activity Tolerance Patient tolerated treatment well    Behavior During Therapy WFL for tasks assessed/performed             Past Medical History:  Diagnosis Date   GERD (gastroesophageal reflux disease)    Knee pain    Lower back pain    Past Surgical History:  Procedure Laterality Date   DIRECT LARYNGOSCOPY N/A 07/12/2018   Procedure: DIRECT LARYNGOSCOPY;  Surgeon: Newman Pies, MD;  Location: [facility] OR;  Service: ENT;  Laterality: N/A;   knee injections     shoulder injections     Patient Active Problem List   Diagnosis Date Noted   Osteoarthritis of knees, bilateral 04/06/2021   Greater trochanteric bursitis of left hip 01/24/2018   Right shoulder pain 07/24/2016   Atypical chest pain 06/17/2016   Osteoarthritis of right knee 04/21/2016   Left shoulder pain 05/15/2014   Low back pain 12/25/2013   Right hip pain 11/29/2013   Headache(784.0) 01/22/2013   Hyperlipidemia LDL goal < 160 07/12/2012   Tendinopathy of rotator cuff, right 06/26/2012   Language barrier 12/08/2011   Ganglion cyst 12/08/2011   Obesity (BMI 30.0-34.9) 12/08/2011    PCP: no PCP  REFERRING PROVIDER: Lenda Kelp, MD  REFERRING DIAG: M25.511 (ICD-10-CM) - Acute pain of right shoulder  THERAPY DIAG:  Acute pain of right shoulder  Rationale for Evaluation and Treatment: Rehabilitation  ONSET DATE: 3 weeks ago  SUBJECTIVE:                                                                                                                                                                                       SUBJECTIVE STATEMENT: Pt reports she was watching TV holding her hands behind her head, and when she lowereed her arms she felt sharp pain in the R shoulder. Currently, the pain has resolved and the pt is no longer taking the pain medication she received.  Hand dominance: Right  PERTINENT HISTORY: NA  PAIN:  Are you having pain? Yes: NPRS scale: 0/10 Pain location: R shoulder, ant GH area Pain description: resolved Aggravating factors: None Relieving factors: Medication  PRECAUTIONS: None  RED FLAGS: None   WEIGHT BEARING RESTRICTIONS: No  FALLS:  Has patient fallen in last 6 months? No  LIVING ENVIRONMENT: Lives with: lives with their family Lives in: House/apartment No issue with accessing or mobility within home  OCCUPATION: Non working  PLOF: Independent  PATIENT GOALS: Pain relief  NEXT MD VISIT: To set up an appt as needed  OBJECTIVE:   DIAGNOSTIC FINDINGS:  05/09/23 [place] R SHoulderIMPRESSION: Hypoechoic changes and thickening of the infraspinatus tendon consistent with tendinopathy.  Similar changes noted for the supraspinatus. Rest of exam grossly normal   PATIENT SURVEYS :  FOTO: Perceived function   83%  COGNITION: Overall cognitive status: Within functional limits for tasks assessed     SENSATION: WFL  POSTURE: Forward head  UPPER EXTREMITY ROM:   B UEs WNLs and equal without pain being reproduced Active ROM Right eval Left eval  Shoulder flexion    Shoulder extension    Shoulder abduction    Shoulder adduction    Shoulder internal rotation    Shoulder external rotation    Elbow flexion    Elbow extension    Wrist flexion    Wrist extension    Wrist ulnar deviation    Wrist radial deviation    Wrist pronation    Wrist supination    (Blank rows = not tested)  UPPER EXTREMITY MMT:  B UEs demonstrated 4+ to 5/5 strength and were equal. Pain was not reproduced. MMT Right eval Left eval  Shoulder flexion     Shoulder extension    Shoulder abduction    Shoulder adduction    Shoulder internal rotation    Shoulder external rotation    M.periddle trapezius    Lower trapezius    Elbow flexion    Elbow extension    Wrist flexion    Wrist extension    Wrist ulnar deviation    Wrist radial deviation    Wrist pronation    Wrist supination    Grip strength (lbs)    (Blank rows = not tested)  SHOULDER SPECIAL TESTS: Impingement tests: Hawkins/Kennedy impingement test: negative SLAP lesions:  NT Instability tests:  NT Rotator cuff assessment: Empty can test: negative and Full can test: negative Biceps assessment: Speed's test: negative  JOINT MOBILITY TESTING:  WNLs  PALPATION:  Not TTP to the R GH jt area or shoulder girdle area   TODAY'S TREATMENT:  Eval Only                                                                                                                                         PATIENT EDUCATION: Education details: Eval findings, POC Person educated: Patient Education method: Explanation Education comprehension: verbalized understanding  HOME EXERCISE PROGRAM: NA  ASSESSMENT:  CLINICAL IMPRESSION: Patient is a 57 y.o. female who was seen today for physical therapy evaluation and treatment for M25.511 (ICD-10-CM) -  Acute pain of right shoulder. Pt presents to PT with her R shoulder pain resolved and with R shoulder ROMs and strength WNLs. Pt's R shoulder pain was not able to be provoked during the PT assessment.  OBJECTIVE IMPAIRMENTS: None assessed.   ACTIVITY LIMITATIONS:  None assessed or reported  PARTICIPATION LIMITATIONS:  None reported  PERSONAL FACTORS:  None  are also affecting patient's functional outcome.   REHAB POTENTIAL: Excellent  CLINICAL DECISION MAKING: Stable/uncomplicated  EVALUATION COMPLEXITY: Low  GOALS:  SHORT TERM GOALS:   LONG TERM GOALS:   PLAN: PT FREQUENCY:  NA  PT DURATION: other: NA  PLANNED INTERVENTIONS: None  due to resoluation of pt's R shoulder pain  PLAN FOR NEXT SESSION: NA   Dyshawn Cangelosi MS, PT 05/24/23 10:28 AM

## 2023-05-24 ENCOUNTER — Ambulatory Visit: Payer: 59 | Attending: Family Medicine

## 2023-05-24 ENCOUNTER — Other Ambulatory Visit: Payer: Self-pay

## 2023-05-24 DIAGNOSIS — M25511 Pain in right shoulder: Secondary | ICD-10-CM | POA: Diagnosis not present
